# Patient Record
Sex: Female | Born: 1955 | Race: White | Hispanic: No | Smoking: Never smoker
Health system: Southern US, Community
[De-identification: ages and names within clinical notes are randomized; demographics above are authoritative.]

## PROBLEM LIST (undated history)

## (undated) DIAGNOSIS — K219 Gastro-esophageal reflux disease without esophagitis: Secondary | ICD-10-CM

## (undated) DIAGNOSIS — I8390 Asymptomatic varicose veins of unspecified lower extremity: Secondary | ICD-10-CM

## (undated) DIAGNOSIS — L309 Dermatitis, unspecified: Secondary | ICD-10-CM

## (undated) DIAGNOSIS — E039 Hypothyroidism, unspecified: Secondary | ICD-10-CM

## (undated) DIAGNOSIS — M7730 Calcaneal spur, unspecified foot: Secondary | ICD-10-CM

## (undated) DIAGNOSIS — IMO0002 Reserved for concepts with insufficient information to code with codable children: Secondary | ICD-10-CM

## (undated) DIAGNOSIS — M722 Plantar fascial fibromatosis: Secondary | ICD-10-CM

## (undated) DIAGNOSIS — R32 Unspecified urinary incontinence: Secondary | ICD-10-CM

## (undated) HISTORY — DX: Dermatitis, unspecified: L30.9

## (undated) HISTORY — DX: Calcaneal spur, unspecified foot: M77.30

## (undated) HISTORY — PX: VARICOSE VEIN SURGERY: SHX832

## (undated) HISTORY — DX: Asymptomatic varicose veins of unspecified lower extremity: I83.90

## (undated) HISTORY — PX: WISDOM TOOTH EXTRACTION: SHX21

## (undated) HISTORY — PX: TOTAL ABDOMINAL HYSTERECTOMY: SHX209

## (undated) HISTORY — DX: Plantar fascial fibromatosis: M72.2

## (undated) HISTORY — DX: Reserved for concepts with insufficient information to code with codable children: IMO0002

---

## 1998-03-02 ENCOUNTER — Ambulatory Visit (HOSPITAL_COMMUNITY): Admission: RE | Admit: 1998-03-02 | Discharge: 1998-03-02 | Payer: Self-pay | Admitting: Family Medicine

## 1998-04-09 ENCOUNTER — Ambulatory Visit (HOSPITAL_BASED_OUTPATIENT_CLINIC_OR_DEPARTMENT_OTHER): Admission: RE | Admit: 1998-04-09 | Discharge: 1998-04-09 | Payer: Self-pay

## 1998-09-30 ENCOUNTER — Other Ambulatory Visit: Admission: RE | Admit: 1998-09-30 | Discharge: 1998-09-30 | Payer: Self-pay | Admitting: Family Medicine

## 1999-03-21 ENCOUNTER — Encounter: Payer: Self-pay | Admitting: Family Medicine

## 1999-03-21 ENCOUNTER — Encounter: Admission: RE | Admit: 1999-03-21 | Discharge: 1999-03-21 | Payer: Self-pay | Admitting: Family Medicine

## 1999-10-11 ENCOUNTER — Other Ambulatory Visit: Admission: RE | Admit: 1999-10-11 | Discharge: 1999-10-11 | Payer: Self-pay | Admitting: Family Medicine

## 2000-10-15 ENCOUNTER — Other Ambulatory Visit: Admission: RE | Admit: 2000-10-15 | Discharge: 2000-10-15 | Payer: Self-pay | Admitting: Family Medicine

## 2001-02-18 ENCOUNTER — Encounter: Payer: Self-pay | Admitting: Family Medicine

## 2001-02-18 ENCOUNTER — Encounter: Admission: RE | Admit: 2001-02-18 | Discharge: 2001-02-18 | Payer: Self-pay | Admitting: Family Medicine

## 2001-07-05 ENCOUNTER — Other Ambulatory Visit: Admission: RE | Admit: 2001-07-05 | Discharge: 2001-07-05 | Payer: Self-pay

## 2001-11-08 ENCOUNTER — Other Ambulatory Visit: Admission: RE | Admit: 2001-11-08 | Discharge: 2001-11-08 | Payer: Self-pay

## 2002-02-19 ENCOUNTER — Encounter: Admission: RE | Admit: 2002-02-19 | Discharge: 2002-02-19 | Payer: Self-pay

## 2002-05-12 ENCOUNTER — Other Ambulatory Visit: Admission: RE | Admit: 2002-05-12 | Discharge: 2002-05-12 | Payer: Self-pay

## 2002-06-19 ENCOUNTER — Other Ambulatory Visit: Admission: RE | Admit: 2002-06-19 | Discharge: 2002-06-19 | Payer: Self-pay | Admitting: Obstetrics and Gynecology

## 2002-10-03 ENCOUNTER — Other Ambulatory Visit: Admission: RE | Admit: 2002-10-03 | Discharge: 2002-10-03 | Payer: Self-pay

## 2003-02-23 ENCOUNTER — Encounter: Admission: RE | Admit: 2003-02-23 | Discharge: 2003-02-23 | Payer: Self-pay | Admitting: Family Medicine

## 2003-03-23 ENCOUNTER — Other Ambulatory Visit: Admission: RE | Admit: 2003-03-23 | Discharge: 2003-03-23 | Payer: Self-pay | Admitting: Obstetrics and Gynecology

## 2003-09-02 ENCOUNTER — Other Ambulatory Visit: Admission: RE | Admit: 2003-09-02 | Discharge: 2003-09-02 | Payer: Self-pay | Admitting: Obstetrics and Gynecology

## 2004-04-26 ENCOUNTER — Encounter: Admission: RE | Admit: 2004-04-26 | Discharge: 2004-04-26 | Payer: Self-pay | Admitting: Family Medicine

## 2004-06-25 ENCOUNTER — Encounter: Admission: RE | Admit: 2004-06-25 | Discharge: 2004-06-25 | Payer: Self-pay | Admitting: Family Medicine

## 2005-02-15 ENCOUNTER — Other Ambulatory Visit: Admission: RE | Admit: 2005-02-15 | Discharge: 2005-02-15 | Payer: Self-pay | Admitting: Obstetrics and Gynecology

## 2005-05-02 ENCOUNTER — Ambulatory Visit (HOSPITAL_COMMUNITY): Admission: RE | Admit: 2005-05-02 | Discharge: 2005-05-02 | Payer: Self-pay | Admitting: Obstetrics and Gynecology

## 2005-05-02 ENCOUNTER — Encounter (INDEPENDENT_AMBULATORY_CARE_PROVIDER_SITE_OTHER): Payer: Self-pay | Admitting: Specialist

## 2006-03-24 ENCOUNTER — Inpatient Hospital Stay (HOSPITAL_COMMUNITY): Admission: AD | Admit: 2006-03-24 | Discharge: 2006-03-24 | Payer: Self-pay | Admitting: Obstetrics and Gynecology

## 2006-04-05 ENCOUNTER — Other Ambulatory Visit: Admission: RE | Admit: 2006-04-05 | Discharge: 2006-04-05 | Payer: Self-pay | Admitting: Obstetrics and Gynecology

## 2006-05-01 ENCOUNTER — Encounter: Admission: RE | Admit: 2006-05-01 | Discharge: 2006-05-01 | Payer: Self-pay | Admitting: Family Medicine

## 2006-05-03 ENCOUNTER — Inpatient Hospital Stay (HOSPITAL_COMMUNITY): Admission: RE | Admit: 2006-05-03 | Discharge: 2006-05-04 | Payer: Self-pay | Admitting: Obstetrics and Gynecology

## 2006-05-03 ENCOUNTER — Encounter (INDEPENDENT_AMBULATORY_CARE_PROVIDER_SITE_OTHER): Payer: Self-pay | Admitting: Specialist

## 2007-02-28 DIAGNOSIS — M7730 Calcaneal spur, unspecified foot: Secondary | ICD-10-CM

## 2007-02-28 HISTORY — DX: Calcaneal spur, unspecified foot: M77.30

## 2007-08-13 ENCOUNTER — Encounter: Admission: RE | Admit: 2007-08-13 | Discharge: 2007-08-13 | Payer: Self-pay | Admitting: Family Medicine

## 2008-08-25 ENCOUNTER — Encounter: Admission: RE | Admit: 2008-08-25 | Discharge: 2008-08-25 | Payer: Self-pay | Admitting: Family Medicine

## 2009-09-13 ENCOUNTER — Encounter: Admission: RE | Admit: 2009-09-13 | Discharge: 2009-09-13 | Payer: Self-pay | Admitting: Family Medicine

## 2010-03-19 ENCOUNTER — Encounter: Payer: Self-pay | Admitting: Family Medicine

## 2010-03-20 ENCOUNTER — Encounter: Payer: Self-pay | Admitting: Family Medicine

## 2010-07-15 NOTE — Op Note (Signed)
Christina Howell, Christina Howell             ACCOUNT NO.:  [id number]   MEDICAL RECORD NO.:  [id number]          PATIENT TYPE:  AMB   LOCATION:  SDC                           [facility]  [facility]   PHYSICIAN:  Charles A. Delcambre, MDDATE OF BIRTH:  1956-01-01   DATE OF PROCEDURE:  05/02/2005  DATE OF DISCHARGE:                                 OPERATIVE REPORT   PREOPERATIVE DIAGNOSES:  1.  Abnormal uterine bleeding, possibly perimenopausal.  2.  Endometrial polyps.   POSTOPERATIVE DIAGNOSES:  1.  Abnormal uterine bleeding, possibly perimenopausal.  2.  Endometrial polyps.   OPERATION/PROCEDURE:  1.  Paracervical block.  2.  Hysteroscopy and dilatation and curettage.  3.  Polypectomy.   SURGEON:  Charles A. Sydnee Cabal, M.D.   ASSISTANT:  None.   COMPLICATIONS:  None.   ESTIMATED BLOOD LOSS:  Less than 20 mL.   SPECIMENS:  Uterine curettings and endometrial polyps to pathology.   COUNTS:  Sponge, needle and instrument counts were correct.   FLUID DEFICIT:  Sorbitol lost during the procedure 0 mL approximately.   ANESTHESIA:  General by endotracheal route.   DESCRIPTION OF PROCEDURE:  The patient was taken to the operating room and  placed in the supine position and general anesthetic was induced without  difficulty via the endotracheal route secondary to the patient's coughing  prior to surgery with a tickle in her throat that developed just prior to  surgery in the holding area.  Judgment was made to protect her airway as I  understood so general anesthetic with intubation was undertaken.  She was  then placed in the dorsal lithotomy position and sterile prep and drape was  undertaken.  Single-tooth tenaculum was placed on the anterior lip of the  cervix with weighted speculum in the vagina and sound was to 9 cm and Hanks  dilators were used to dilate enough to pass a 5 mm hysteroscope.  The scope  was used to visualize several posterior wall.  Endometrial polyps were  documented  consistent with findings on sonohystogram.  Perioperatively using  polyp forceps and visualization, polyps were removed without difficulty.  Generalized curettage with banjo curet was then undertaken with no further  polypoid tissue but endometrial curettings were removed.  There was no  evidence of perforation prior to proceeding with dilation and curettage.  Paracervical block was placed at 4 and 8  o'clock with 20 mL divided 0.25% plain Marcaine for postoperative pain  control.  Prior to taking the patient to recovery, electrocautery was used  to on the tenaculum site with good hemostasis noted.  The patient was taken  to recovery with the physician in attendance having tolerated the procedure  well.      Charles A. Sydnee Cabal, MD  Electronically Signed     CAD/MEDQ  D:  05/02/2005  T:  05/03/2005  Job:  [id number]

## 2010-07-15 NOTE — Op Note (Signed)
[name], Christina Howell             ACCOUNT NO.:  [id number]   MEDICAL RECORD NO.:  [id number]          PATIENT TYPE:  OIB   LOCATION:  9399                          [facility]  [facility]   PHYSICIAN:  Charles A. Delcambre, MDDATE OF BIRTH:  Jul 31, 1955   DATE OF PROCEDURE:  05/03/2006  DATE OF DISCHARGE:                               OPERATIVE REPORT   PREOPERATIVE DIAGNOSIS:  Menorrhagia, failed medical and surgical  therapy.   POSTOPERATIVE DIAGNOSIS:  Menorrhagia, failed medical and surgical  therapy.   PROCEDURE:  Transvaginal hysterectomy, bilateral salpingo-oophorectomy.   SURGEON:  Charles A. Sydnee Cabal, M.D.   ASSISTANT:  Gerald Leitz, M.D.   COMPLICATIONS:  None.   ESTIMATED BLOOD LOSS:  250 mL.   ANESTHESIA:  General by the endotracheal route.   SPECIMEN:  The uterus and bilateral tubes and ovaries were sent to  pathology.   OPERATIVE FINDINGS:  Normal appearing ovaries.   COUNTS:  Instrument, sponge, and needle count correct x2.   URINE OUTPUT:  60 mL.   FLUIDS REPLACED:  1300 mL LR.   DESCRIPTION OF PROCEDURE:  The patient was taken to the operating room  and placed in a supine position.  She was given general anesthetic and  placed in the dorsal lithotomy position in universal stirrups.  Sterile  prep and drape was undertaken.  A weighted speculum was placed in the  vagina.  Lahey clamps were used to grasp the cervix and the uterus and  pulled it down nicely.  In the vault, a circumferential injection of 1%  lidocaine with epinephrine was done to assist with hemostasis.  A  scoring incision was made circumferentially around the cervix at  reflection of the vaginal mucosa. Sharp dissection was then used with  Mayo scissors to develop the mobilization of the bladder off the lower  uterine segment.  The bladder pillars were incised.  The Ray-Tec was  then passed into this space further dissecting the space bluntly. After  removal of the Ray-Tec, the peritoneum could  be seen with a Deaver  easily and was entered with Metzenbaum scissors without damage to the  bowel or bladder.  A posterior colpotomy was then done without  difficulty.  A long weighted speculum was placed in the posterior cul-de-  sac.   The uterosacral ligaments were taken bilaterally, transfixion stitched  with 0 Vicryl and held. Uterine vessels were isolated on either side and  taken in two bites each to get up to the cardinal ligaments and  hemostasis was excellent with 0 Vicryl simple stitch and then  transfixion stitch.  The remaining pedicles were taken up either side  including the remainder of the cardinal ligaments and the broad  ligament.  Once we had dissection and pedicles taken up to the utero-  ovarian pedicles, the uterus was flipped anteriorly and the utero-  ovarian pedicles were taken, free tied, and transfixion stitch on both  sides.  The specimen was handed off to pathology.  On the left, the tube  and ovary were seen and isolated with a Kelly clamp and excised to go to  pathology.  A  free tie and transfixion stitch was placed on the  infundibulopelvic pedicle with good hemostasis.  A small peritoneal  window on the sidewall was closed with 2-0 Vicryl with good hemostasis.  Attention was turned to the right side and, in a like fashion, the ovary  and tube were excised with the [facility] clamp holding the infundibulopelvic  ligament pedicle, free tie and transfixion stitch were placed with 0  Vicryl and with good hemostasis resulting. Again, there was a small  window of peritoneum on the right side.  This was closed with a 2-0  Vicryl with good hemostasis resulting.   All pedicles had good hemostasis.  There was minor bleeding at the  posterior edge of the cuff.  Richardson angle sutures of 0 Vicryl were  placed at the vaginal angle regions bilaterally and cut.  A running  suture of 0 Vicryl running interlocking across the cuff was then placed  to close the cuff.   Hemostasis was excellent.  The Foley catheter was  left in place.  The patient was taken to recovery with the physician in  attendance, having tolerated the procedure well.      Charles A. Sydnee Cabal, MD  Electronically Signed     CAD/MEDQ  D:  05/03/2006  T:  05/03/2006  Job:  [id number]

## 2010-07-15 NOTE — H&P (Signed)
Christina Howell, Christina Howell             ACCOUNT NO.:  [id number]   MEDICAL RECORD NO.:  [id number]          PATIENT TYPE:  AMB   LOCATION:  SDC                           [facility]   PHYSICIAN:  Charles A. Delcambre, MDDATE OF BIRTH:  1956/02/16   DATE OF ADMISSION:  05/02/2005  DATE OF DISCHARGE:                                HISTORY & PHYSICAL   This is a 55 year old para 2-0-1-2 to be admitted on May 02, 2005, to  undergo hysteroscopy D&C for irregular menstrual periods and known  endometrial polyps on sonohysterogram.  Endometrial biopsy was done on September 21, 2003, which was benign.  Sonohysterogram was done on March 22, 2005,  which showed multiple posterior wall defects, three polyps seen on the  posterior wall and for this reason, she is to be admitted to undergo  hysteroscopy D&C with polypectomy.  She gives informed consent, accepts  risks of infection, bleeding, bowel and bladder damage, blood products with  risks including hepatitis and HIV exposure, uterine perforation risk.  All  questions were answered and we will proceed as outlined.  She will remain  NPO past midnight on the evening prior to the procedure.   PAST MEDICAL HISTORY:  None.   PAST SURGICAL HISTORY:  None.   MEDICATIONS:  None.   ALLERGIES:  No known drug allergies.   SOCIAL HISTORY:  Divorced.  Teacher at [employer].  One son, 41,  who is autistic.   FAMILY HISTORY:  Positive for diabetes, hypertension, and skin cancer.   REVIEW OF SYSTEMS:  Irregular periods.  No fever, chills, nausea, vomiting,  diarrhea, or constipation.  Some seasonal allergies.  No chest pain,  shortness of breath, or wheezing.  No diarrhea, constipation, or bleeding,  melena or hematochezia. No urgency, frequency, dysuria, incontinence, or  hematuria.  No galactorrhea or emotional changes.   PHYSICAL EXAMINATION:  GENERAL:  Alert and oriented x3, in no acute  distress.  VITAL SIGNS:  Blood pressure 94/60,  weight 173 pounds, pulse 80.  HEENT:  Grossly within normal limits.  NECK:  Supple without thyromegaly or adenopathy.  LUNGS:  Clear bilaterally.  BACK:  No CVAT.  BREASTS:  Symmetrical, otherwise deferred.  HEART:  Regular rate and rhythm without murmurs, rubs, or gallops.  ABDOMEN:  Soft, flat, and nontender.  No hepatosplenomegaly or other masses  noted.  PELVIC:  Normal external female genitalia, Bartholin's, urethra, and Skene's  within normal limits.  Vault without discharge or lesions.  Multipara  cervix.  Uterus is 8 weeks size and mid plane.  Adnexa nontender without  masses bilaterally.  Ovaries palpably normal size bilaterally as well as  seen on ultrasound to be normal.  Anus and perineal body appear normal.   ASSESSMENT:  Irregular bleeding, possibly perimenopausal, but with known  endometrial polyps x3 on posterior wall of the uterus.   PLAN:  Hysteroscopy and D&C, NPO past midnight.  Preoperative CBC and serum  pregnancy test.  All questions were answered and we will proceed as  outlined.      Charles A. Sydnee Cabal, MD  Electronically Signed  CAD/MEDQ  D:  04/27/2005  T:  04/27/2005  Job:  [id number]

## 2010-07-15 NOTE — H&P (Signed)
Christina Howell, Christina Howell             ACCOUNT NO.:  [id number]   MEDICAL RECORD NO.:  [id number]          PATIENT TYPE:  AMB   LOCATION:  SDC                           [facility]   PHYSICIAN:  Charles A. Delcambre, MDDATE OF BIRTH:  [birth year]   DATE OF ADMISSION:  05/03/2006  DATE OF DISCHARGE:                              HISTORY & PHYSICAL   Patient is to be admitted to undergo transvaginal hysterectomy and  probable bilateral salpingo-oophorectomy secondary to ongoing bleeding  despite hysteroscopy, D&D, and polypectomy back in March of 2007.   PAST MEDICAL HISTORY:  None.   PAST SURGICAL HISTORY:  Hysteroscopy, D&C, polyp resection.   MEDICATIONS:  None.   ALLERGIES:  NO KNOWN DRUG ALLERGIES.   SOCIAL HISTORY:  No tobacco, ethanol, or drug use.  She is divorced and  not sexually active at this time.  She is a Runner, broadcasting/film/video at [employer].   FAMILY HISTORY:  Positive for diabetes, hypertension, and skin cancer.   REVIEW OF SYSTEMS:  No fever, chills, nausea, vomiting, diarrhea,  constipation.  Some seasonal allergy.  No chest pain, shortness of  breath, or wheezing.  No diarrhea, constipation, bleeding, melena, or  hematochezia.  No urgency, frequency, dysuria, incontinence, or  hematuria.  No galactorrhea or emotional changes.  She has recently  gotten over a cold that she had for two weeks and is asymptomatic over  the last week.   PHYSICAL EXAMINATION:  GENERAL:  Alert and oriented x3 in no distress.  VITAL SIGNS:  Blood pressure 110/80, respirations 20, pulse 76,  afebrile.  HEENT:  Grossly within normal limits.  NECK:  Supple without thyromegaly or adenopathy.  LUNGS:  Clear bilaterally.  HEART:  Regular rate and rhythm without murmurs, rubs, or gallops.  BREASTS:  No masses, tenderness, discharge, skin, or nipple changes at  the time of my last examination.  My last examination for a breast exam  was February 15, 2005.  Otherwise, symmetrical and without  symptoms.  ABDOMEN:  No hepatosplenomegaly or other masses noted.  No guarding,  rebound, or peritoneal signs.  BACK:  No CVAT.  Vertebral column nontender to palpation.  PELVIC:  Normal external female genitalia.  Bartholin, urethral, and  Skene's glands within normal limits.  Vault without discharge or  lesions.  Cervix folds down to was a centimeter of the introitus with a  tenaculum.  Uterus 8 to 10-week size.  Adnexa nontender without masses  bilaterally.  Ovaries normal size bilaterally.  EXTREMITIES:  Nontender.   ASSESSMENT:  Continued menorrhagia despite hysteroscopy, D&C, and  polypectomy, not responding to hormonal therapy with birth control pills  as well.   PLAN:  Transvaginal hysterectomy with possible bilateral salpingo-  oophorectomy (desired).  We will proceed but not go above for ovaries if  necessary and oophorectomy cannot technically be achieved through  vagina.  She accepts the risks of infection, bleeding, bowel or bladder  damage, blood product risks including hepatitis and HIV exposure,  ureteral damage, and DVT.  All questions were answered.  She understands  oophorectomy will put her into menopause.  She declines  hormone  replacement therapy at this time.  She will remain n.p.o. past midnight.  Preoperative CBC and type screen will be done.  Preoperative cefoxitin 2  g IV.  SCDs will be used perioperatively.  She will remain n.p.o. for  eight hours prior to surgery.  All questions were answered.  We will  proceed as outlined.      Charles A. Sydnee Cabal, MD  Electronically Signed     CAD/MEDQ  D:  05/01/2006  T:  05/01/2006  Job:  [id number]

## 2010-07-20 ENCOUNTER — Encounter: Payer: Self-pay | Admitting: Pulmonary Disease

## 2010-07-21 ENCOUNTER — Institutional Professional Consult (permissible substitution): Payer: Self-pay | Admitting: Pulmonary Disease

## 2010-07-22 ENCOUNTER — Ambulatory Visit (INDEPENDENT_AMBULATORY_CARE_PROVIDER_SITE_OTHER): Payer: 59 | Admitting: Pulmonary Disease

## 2010-07-22 ENCOUNTER — Encounter: Payer: Self-pay | Admitting: Pulmonary Disease

## 2010-07-22 VITALS — BP 100/64 | HR 81 | Temp 98.0°F | Ht 68.5 in | Wt 176.0 lb

## 2010-07-22 DIAGNOSIS — R05 Cough: Secondary | ICD-10-CM

## 2010-07-22 DIAGNOSIS — R0789 Other chest pain: Secondary | ICD-10-CM | POA: Insufficient documentation

## 2010-07-22 DIAGNOSIS — R053 Chronic cough: Secondary | ICD-10-CM

## 2010-07-22 NOTE — Patient Instructions (Signed)
Trial of nexium 40mg  one in am and pm before meal for next 2 weeks. Chlorpheniramine 8mg  at bedtime for next 2 weeks.  Please call and give me feedback in 2 weeks with how things are going.  Avoid throat clearing....keep hard candy, no peppermint or menthol, in your mouth all day to see if helps with sensation in throat.

## 2010-07-22 NOTE — Assessment & Plan Note (Signed)
The pt has persistent chest tightness and a significant globus sensation, but denies any cough or sinus/chest congestion.  She has normal spirometry and cxr recently, and her lungs are clear today on exam.  I suspect this is not a pulmonary issue, and would be suspicious of LPR.  She was tried on low dose PPI without improvement, but would like to try her on high dose to see if a difference.  Would also consider postnasal drip, but this usually doesn't cause chest tightness.  I have told her that normal spiro does not 100% exclude asthma, but her history is not suggestive of this.  If she continues to have symptoms, can consider methacholine challenge testing.

## 2010-07-22 NOTE — Progress Notes (Signed)
  Subjective:    Patient ID: Christina Howell, female    DOB: March 04, 1955, 55 y.o.   MRN: [id number]  HPI The pt is a 55y/o female who I have been asked to see for chest tightness of unknown origin.  She developed her symptom in March of this year, and was associated with a globus sensation and constant throat clearing.  She denies having a cough or sob.  She would "force" a cough to try and clear her globus sensation, but did not feel the urge to cough.  This was worse at night, and would also interfere with sleep.  She had a cxr in Apr that was unremarkable, and her spirometry was normal as well.  She was treated with abx times two, as well as a prednisone taper, and was without improvement.  The pt denies sinus pressure or purulence during this time, but felt she may have a pnd.  She denies any type of reflux symptoms.  2 days ago she developed viral URI sx with both sinus and GI involvement.   Review of Systems  Constitutional: Negative for fever and unexpected weight change.  HENT: Positive for trouble swallowing. Negative for ear pain, nosebleeds, congestion, sore throat, rhinorrhea, sneezing, dental problem, postnasal drip and sinus pressure.   Eyes: Negative for redness and itching.  Respiratory: Positive for cough and shortness of breath. Negative for chest tightness and wheezing.   Cardiovascular: Positive for chest pain. Negative for palpitations and leg swelling.  Gastrointestinal: Negative for nausea and vomiting.  Genitourinary: Negative for dysuria.  Musculoskeletal: Negative for joint swelling.  Skin: Negative for rash.  Neurological: Negative for headaches.  Hematological: Does not bruise/bleed easily.  Psychiatric/Behavioral: Negative for dysphoric mood. The patient is not nervous/anxious.        Objective:   Physical Exam Constitutional:  Well developed, no acute distress  HENT:  Nares patent without discharge, but boggy mucosa with erythema.   Oropharynx without exudate,  palate and uvula are normal  Eyes:  Perrla, eomi, no scleral icterus  Neck:  No JVD, no TMG  Cardiovascular:  Normal rate, regular rhythm, no rubs or gallops.  No murmurs        Intact distal pulses  Pulmonary :  Normal breath sounds, no stridor or respiratory distress   No rales, rhonchi, or wheezing  Abdominal:  Soft, nondistended, bowel sounds present.  No tenderness noted.   Musculoskeletal:  No lower extremity edema noted, +varicosities  Lymph Nodes:  No cervical lymphadenopathy noted  Skin:  No cyanosis noted  Neurologic:  Alert, appropriate, moves all 4 extremities without obvious deficit.         Assessment & Plan:

## 2010-08-09 ENCOUNTER — Telehealth: Payer: Self-pay | Admitting: Pulmonary Disease

## 2010-08-09 MED ORDER — HYDROXYZINE HCL 50 MG PO TABS: 50.0000 mg | ORAL_TABLET | Freq: Two times a day (BID) | ORAL | Status: AC

## 2010-08-09 NOTE — Telephone Encounter (Signed)
Per SN: rec to continue all therapies from Dr. Shelle Iron and to f/u with him. Can call in Hydroxyzine 50mg  1 po bid # 20 x 0 refills.    Called and spoke with pt. Pt aware of SN's recs and rx sent to pharmacy--- [facility] on [address].

## 2010-08-09 NOTE — Telephone Encounter (Signed)
[id number] 

## 2010-08-09 NOTE — Telephone Encounter (Signed)
Called and spoke with pt.  Pt was seen by [facility] on 07/22/10 for a pulm consult for tightness in chest and globus sensation in throat.  Pt states she was given nexium samples to take bid which she is now about out of.  Pt states since taking the nexium she has noticed no change- states she still has tightness in her chest that comes and goes and is no better or worse and also still has this "wad in throat" feeling that hasn't improved.  Pt states she is also sucking on hard candy, avoiding throat clearing and taking Chlorpheniramine 8mg  at bedtime with no relief of symptoms. Pt states the chlorpheniramine "dries up her mouth and nose but still continues to have this sensation in her throat."   Pt denies a cough or sob or congestion.  Kc out of office this week. Will forward message to doc of the day to address.

## 2010-09-07 ENCOUNTER — Other Ambulatory Visit: Payer: Self-pay | Admitting: Family Medicine

## 2010-09-07 ENCOUNTER — Other Ambulatory Visit: Payer: Self-pay | Admitting: Pulmonary Disease

## 2010-09-07 ENCOUNTER — Telehealth: Payer: Self-pay | Admitting: Pulmonary Disease

## 2010-09-07 DIAGNOSIS — R6889 Other general symptoms and signs: Secondary | ICD-10-CM | POA: Insufficient documentation

## 2010-09-07 DIAGNOSIS — Z1231 Encounter for screening mammogram for malignant neoplasm of breast: Secondary | ICD-10-CM

## 2010-09-07 NOTE — Telephone Encounter (Signed)
Called and spoke with pt. She states that although is is not coughing any longer, the "junk in throat" is not getting any better. She states that chlorpheneramine caused dry mouth, even with just taking 1/2 tab before bedtime, so she has stopped. She wants to know what the next step may be to help with this. Please advise, thanks!

## 2010-09-07 NOTE — Telephone Encounter (Signed)
Pt states she stopped nexium in June because it was not helping. She denies any sinus pressure of congestion.  I advised her is takes time to resolve and she states its been long enough. Please advise. Carron Curie, CMA

## 2010-09-07 NOTE — Telephone Encounter (Signed)
The "junk in throat" is usually caused by postnasal drip or reflux disease.  Is she still taking something for reflux, and if she is, what?  Is she having sinus pressure or congestion that is ongoing?  Let me know.   The sensation in her throat usually takes time to resolve.  The hard candy should help as well.

## 2010-09-07 NOTE — Telephone Encounter (Signed)
If she is still having a feeling of fullness in her throat, needs to have evaluation by ENT to make sure there is nothing structurally wrong, and if they see anything to explain her sensation.  Will send referral to [facility]

## 2010-09-08 NOTE — Telephone Encounter (Signed)
Spoke with pt and notified of recs per KC She verbalized understanding and denied any questions  

## 2010-09-08 NOTE — Telephone Encounter (Signed)
[email or web address]  

## 2010-09-21 ENCOUNTER — Ambulatory Visit: Payer: 59

## 2010-09-21 ENCOUNTER — Ambulatory Visit
Admission: RE | Admit: 2010-09-21 | Discharge: 2010-09-21 | Disposition: A | Discharge status: Home / Self Care | Payer: 59 | Source: Ambulatory Visit | Attending: Family Medicine | Admitting: Family Medicine

## 2010-09-21 DIAGNOSIS — Z1231 Encounter for screening mammogram for malignant neoplasm of breast: Secondary | ICD-10-CM

## 2011-09-04 ENCOUNTER — Other Ambulatory Visit: Payer: Self-pay | Admitting: Family Medicine

## 2011-09-04 DIAGNOSIS — Z1231 Encounter for screening mammogram for malignant neoplasm of breast: Secondary | ICD-10-CM

## 2011-09-22 ENCOUNTER — Ambulatory Visit
Admission: RE | Admit: 2011-09-22 | Discharge: 2011-09-22 | Disposition: A | Discharge status: Home / Self Care | Payer: 59 | Source: Ambulatory Visit | Attending: Family Medicine | Admitting: Family Medicine

## 2011-09-22 DIAGNOSIS — Z1231 Encounter for screening mammogram for malignant neoplasm of breast: Secondary | ICD-10-CM

## 2012-08-23 ENCOUNTER — Other Ambulatory Visit: Payer: Self-pay

## 2012-08-23 DIAGNOSIS — Z1231 Encounter for screening mammogram for malignant neoplasm of breast: Secondary | ICD-10-CM

## 2012-09-23 ENCOUNTER — Ambulatory Visit
Admission: RE | Admit: 2012-09-23 | Discharge: 2012-09-23 | Disposition: A | Discharge status: Home / Self Care | Payer: 59 | Source: Ambulatory Visit

## 2012-09-23 DIAGNOSIS — Z1231 Encounter for screening mammogram for malignant neoplasm of breast: Secondary | ICD-10-CM

## 2013-08-21 ENCOUNTER — Other Ambulatory Visit: Payer: Self-pay

## 2013-08-21 DIAGNOSIS — Z1231 Encounter for screening mammogram for malignant neoplasm of breast: Secondary | ICD-10-CM

## 2013-09-24 ENCOUNTER — Ambulatory Visit: Payer: 59

## 2013-09-25 ENCOUNTER — Ambulatory Visit
Admission: RE | Admit: 2013-09-25 | Discharge: 2013-09-25 | Disposition: A | Discharge status: Home / Self Care | Payer: Commercial Managed Care - PPO | Source: Ambulatory Visit

## 2013-09-25 DIAGNOSIS — Z1231 Encounter for screening mammogram for malignant neoplasm of breast: Secondary | ICD-10-CM

## 2014-09-07 ENCOUNTER — Other Ambulatory Visit: Payer: Self-pay

## 2014-09-07 DIAGNOSIS — Z1231 Encounter for screening mammogram for malignant neoplasm of breast: Secondary | ICD-10-CM

## 2014-09-28 ENCOUNTER — Ambulatory Visit
Admission: RE | Admit: 2014-09-28 | Discharge: 2014-09-28 | Disposition: A | Discharge status: Home / Self Care | Payer: Commercial Managed Care - PPO | Source: Ambulatory Visit

## 2014-09-28 DIAGNOSIS — Z1231 Encounter for screening mammogram for malignant neoplasm of breast: Secondary | ICD-10-CM

## 2015-09-03 ENCOUNTER — Other Ambulatory Visit: Payer: Self-pay | Admitting: Family Medicine

## 2015-09-03 DIAGNOSIS — Z1231 Encounter for screening mammogram for malignant neoplasm of breast: Secondary | ICD-10-CM

## 2015-09-29 ENCOUNTER — Ambulatory Visit
Admission: RE | Admit: 2015-09-29 | Discharge: 2015-09-29 | Disposition: A | Discharge status: Home / Self Care | Payer: 59 | Source: Ambulatory Visit | Attending: Family Medicine | Admitting: Family Medicine

## 2015-09-29 DIAGNOSIS — Z1231 Encounter for screening mammogram for malignant neoplasm of breast: Secondary | ICD-10-CM

## 2016-08-25 ENCOUNTER — Other Ambulatory Visit: Payer: Self-pay | Admitting: Family Medicine

## 2016-08-25 DIAGNOSIS — Z1231 Encounter for screening mammogram for malignant neoplasm of breast: Secondary | ICD-10-CM

## 2016-10-12 ENCOUNTER — Ambulatory Visit
Admission: RE | Admit: 2016-10-12 | Discharge: 2016-10-12 | Disposition: A | Discharge status: Home / Self Care | Payer: PRIVATE HEALTH INSURANCE | Source: Ambulatory Visit | Attending: Family Medicine | Admitting: Family Medicine

## 2016-10-12 DIAGNOSIS — Z1231 Encounter for screening mammogram for malignant neoplasm of breast: Secondary | ICD-10-CM

## 2017-03-13 DIAGNOSIS — E069 Thyroiditis, unspecified: Secondary | ICD-10-CM | POA: Diagnosis not present

## 2017-03-13 DIAGNOSIS — Z6829 Body mass index (BMI) 29.0-29.9, adult: Secondary | ICD-10-CM | POA: Diagnosis not present

## 2017-03-13 DIAGNOSIS — R1310 Dysphagia, unspecified: Secondary | ICD-10-CM | POA: Diagnosis not present

## 2017-03-13 DIAGNOSIS — R0602 Shortness of breath: Secondary | ICD-10-CM | POA: Diagnosis not present

## 2017-03-13 DIAGNOSIS — R221 Localized swelling, mass and lump, neck: Secondary | ICD-10-CM | POA: Diagnosis not present

## 2017-03-20 DIAGNOSIS — E041 Nontoxic single thyroid nodule: Secondary | ICD-10-CM | POA: Diagnosis not present

## 2017-03-20 DIAGNOSIS — J029 Acute pharyngitis, unspecified: Secondary | ICD-10-CM | POA: Diagnosis not present

## 2017-03-20 DIAGNOSIS — E039 Hypothyroidism, unspecified: Secondary | ICD-10-CM | POA: Diagnosis not present

## 2017-03-20 DIAGNOSIS — R1310 Dysphagia, unspecified: Secondary | ICD-10-CM | POA: Diagnosis not present

## 2017-03-20 DIAGNOSIS — Z6829 Body mass index (BMI) 29.0-29.9, adult: Secondary | ICD-10-CM | POA: Diagnosis not present

## 2017-03-20 DIAGNOSIS — R0989 Other specified symptoms and signs involving the circulatory and respiratory systems: Secondary | ICD-10-CM | POA: Diagnosis not present

## 2017-03-20 DIAGNOSIS — E069 Thyroiditis, unspecified: Secondary | ICD-10-CM | POA: Diagnosis not present

## 2017-03-27 DIAGNOSIS — F411 Generalized anxiety disorder: Secondary | ICD-10-CM | POA: Diagnosis not present

## 2017-03-27 DIAGNOSIS — R221 Localized swelling, mass and lump, neck: Secondary | ICD-10-CM | POA: Diagnosis not present

## 2017-03-27 DIAGNOSIS — R0600 Dyspnea, unspecified: Secondary | ICD-10-CM | POA: Diagnosis not present

## 2017-03-27 DIAGNOSIS — J358 Other chronic diseases of tonsils and adenoids: Secondary | ICD-10-CM | POA: Diagnosis not present

## 2017-03-27 DIAGNOSIS — R0602 Shortness of breath: Secondary | ICD-10-CM | POA: Diagnosis not present

## 2017-03-27 DIAGNOSIS — E049 Nontoxic goiter, unspecified: Secondary | ICD-10-CM | POA: Diagnosis not present

## 2017-03-27 DIAGNOSIS — R070 Pain in throat: Secondary | ICD-10-CM | POA: Diagnosis not present

## 2017-03-27 DIAGNOSIS — J039 Acute tonsillitis, unspecified: Secondary | ICD-10-CM | POA: Diagnosis not present

## 2017-03-27 DIAGNOSIS — Z8639 Personal history of other endocrine, nutritional and metabolic disease: Secondary | ICD-10-CM | POA: Diagnosis not present

## 2017-03-27 DIAGNOSIS — Z90710 Acquired absence of both cervix and uterus: Secondary | ICD-10-CM | POA: Diagnosis not present

## 2017-03-28 DIAGNOSIS — J358 Other chronic diseases of tonsils and adenoids: Secondary | ICD-10-CM | POA: Diagnosis not present

## 2017-03-30 DIAGNOSIS — R1310 Dysphagia, unspecified: Secondary | ICD-10-CM | POA: Diagnosis not present

## 2017-03-30 DIAGNOSIS — R682 Dry mouth, unspecified: Secondary | ICD-10-CM | POA: Diagnosis not present

## 2017-03-30 DIAGNOSIS — J029 Acute pharyngitis, unspecified: Secondary | ICD-10-CM | POA: Diagnosis not present

## 2017-03-30 DIAGNOSIS — Z6828 Body mass index (BMI) 28.0-28.9, adult: Secondary | ICD-10-CM | POA: Diagnosis not present

## 2017-03-30 DIAGNOSIS — K219 Gastro-esophageal reflux disease without esophagitis: Secondary | ICD-10-CM | POA: Diagnosis not present

## 2017-04-02 DIAGNOSIS — E039 Hypothyroidism, unspecified: Secondary | ICD-10-CM | POA: Diagnosis not present

## 2017-04-02 DIAGNOSIS — R5383 Other fatigue: Secondary | ICD-10-CM | POA: Diagnosis not present

## 2017-04-02 DIAGNOSIS — E069 Thyroiditis, unspecified: Secondary | ICD-10-CM | POA: Diagnosis not present

## 2017-04-02 DIAGNOSIS — R682 Dry mouth, unspecified: Secondary | ICD-10-CM | POA: Diagnosis not present

## 2017-04-04 DIAGNOSIS — Z6828 Body mass index (BMI) 28.0-28.9, adult: Secondary | ICD-10-CM | POA: Diagnosis not present

## 2017-04-04 DIAGNOSIS — K219 Gastro-esophageal reflux disease without esophagitis: Secondary | ICD-10-CM | POA: Diagnosis not present

## 2017-04-04 DIAGNOSIS — R740 Nonspecific elevation of levels of transaminase and lactic acid dehydrogenase [LDH]: Secondary | ICD-10-CM | POA: Diagnosis not present

## 2017-04-04 DIAGNOSIS — E039 Hypothyroidism, unspecified: Secondary | ICD-10-CM | POA: Diagnosis not present

## 2017-04-04 DIAGNOSIS — E785 Hyperlipidemia, unspecified: Secondary | ICD-10-CM | POA: Diagnosis not present

## 2017-04-05 DIAGNOSIS — F458 Other somatoform disorders: Secondary | ICD-10-CM | POA: Diagnosis not present

## 2017-04-05 DIAGNOSIS — Z6828 Body mass index (BMI) 28.0-28.9, adult: Secondary | ICD-10-CM | POA: Diagnosis not present

## 2017-04-05 DIAGNOSIS — E039 Hypothyroidism, unspecified: Secondary | ICD-10-CM | POA: Diagnosis not present

## 2017-04-05 DIAGNOSIS — K219 Gastro-esophageal reflux disease without esophagitis: Secondary | ICD-10-CM | POA: Diagnosis not present

## 2017-04-09 DIAGNOSIS — K2970 Gastritis, unspecified, without bleeding: Secondary | ICD-10-CM | POA: Diagnosis not present

## 2017-04-09 DIAGNOSIS — K228 Other specified diseases of esophagus: Secondary | ICD-10-CM | POA: Diagnosis not present

## 2017-04-09 DIAGNOSIS — R1310 Dysphagia, unspecified: Secondary | ICD-10-CM | POA: Diagnosis not present

## 2017-04-09 DIAGNOSIS — K2950 Unspecified chronic gastritis without bleeding: Secondary | ICD-10-CM | POA: Diagnosis not present

## 2017-04-09 DIAGNOSIS — K449 Diaphragmatic hernia without obstruction or gangrene: Secondary | ICD-10-CM | POA: Diagnosis not present

## 2017-04-09 DIAGNOSIS — R10816 Epigastric abdominal tenderness: Secondary | ICD-10-CM | POA: Diagnosis not present

## 2017-04-09 DIAGNOSIS — K210 Gastro-esophageal reflux disease with esophagitis: Secondary | ICD-10-CM | POA: Diagnosis not present

## 2017-04-09 DIAGNOSIS — R12 Heartburn: Secondary | ICD-10-CM | POA: Diagnosis not present

## 2017-04-19 DIAGNOSIS — R1310 Dysphagia, unspecified: Secondary | ICD-10-CM | POA: Diagnosis not present

## 2017-04-20 ENCOUNTER — Other Ambulatory Visit (HOSPITAL_COMMUNITY): Payer: Self-pay | Admitting: Family Medicine

## 2017-04-20 DIAGNOSIS — R1310 Dysphagia, unspecified: Secondary | ICD-10-CM

## 2017-05-14 ENCOUNTER — Ambulatory Visit (HOSPITAL_COMMUNITY)
Admission: RE | Admit: 2017-05-14 | Discharge: 2017-05-14 | Disposition: A | Discharge status: Home / Self Care | Payer: BLUE CROSS/BLUE SHIELD | Source: Ambulatory Visit | Attending: Family Medicine | Admitting: Family Medicine

## 2017-05-14 DIAGNOSIS — R1310 Dysphagia, unspecified: Secondary | ICD-10-CM | POA: Insufficient documentation

## 2017-05-14 DIAGNOSIS — A630 Anogenital (venereal) warts: Secondary | ICD-10-CM | POA: Diagnosis not present

## 2017-06-01 DIAGNOSIS — T783XXA Angioneurotic edema, initial encounter: Secondary | ICD-10-CM | POA: Diagnosis not present

## 2017-06-01 DIAGNOSIS — J3089 Other allergic rhinitis: Secondary | ICD-10-CM | POA: Diagnosis not present

## 2017-06-25 DIAGNOSIS — H01009 Unspecified blepharitis unspecified eye, unspecified eyelid: Secondary | ICD-10-CM | POA: Diagnosis not present

## 2017-06-26 DIAGNOSIS — A630 Anogenital (venereal) warts: Secondary | ICD-10-CM | POA: Diagnosis not present

## 2017-07-20 DIAGNOSIS — A630 Anogenital (venereal) warts: Secondary | ICD-10-CM | POA: Diagnosis not present

## 2017-10-17 DIAGNOSIS — E039 Hypothyroidism, unspecified: Secondary | ICD-10-CM | POA: Diagnosis not present

## 2017-10-17 DIAGNOSIS — Z8249 Family history of ischemic heart disease and other diseases of the circulatory system: Secondary | ICD-10-CM | POA: Diagnosis not present

## 2017-10-17 DIAGNOSIS — Z13220 Encounter for screening for lipoid disorders: Secondary | ICD-10-CM | POA: Diagnosis not present

## 2017-10-17 DIAGNOSIS — Z0000 Encounter for general adult medical examination without abnormal findings: Secondary | ICD-10-CM | POA: Diagnosis not present

## 2017-11-01 ENCOUNTER — Other Ambulatory Visit: Payer: Self-pay | Admitting: Family Medicine

## 2017-11-01 DIAGNOSIS — Z1231 Encounter for screening mammogram for malignant neoplasm of breast: Secondary | ICD-10-CM

## 2017-11-05 DIAGNOSIS — L718 Other rosacea: Secondary | ICD-10-CM | POA: Diagnosis not present

## 2017-11-20 DIAGNOSIS — J029 Acute pharyngitis, unspecified: Secondary | ICD-10-CM | POA: Diagnosis not present

## 2017-11-27 ENCOUNTER — Ambulatory Visit
Admission: RE | Admit: 2017-11-27 | Discharge: 2017-11-27 | Disposition: A | Discharge status: Home / Self Care | Payer: BLUE CROSS/BLUE SHIELD | Source: Ambulatory Visit | Attending: Family Medicine | Admitting: Family Medicine

## 2017-11-27 DIAGNOSIS — Z1231 Encounter for screening mammogram for malignant neoplasm of breast: Secondary | ICD-10-CM

## 2017-12-07 DIAGNOSIS — H6123 Impacted cerumen, bilateral: Secondary | ICD-10-CM | POA: Diagnosis not present

## 2017-12-07 DIAGNOSIS — H9203 Otalgia, bilateral: Secondary | ICD-10-CM | POA: Diagnosis not present

## 2017-12-07 DIAGNOSIS — R42 Dizziness and giddiness: Secondary | ICD-10-CM | POA: Diagnosis not present

## 2017-12-14 DIAGNOSIS — B029 Zoster without complications: Secondary | ICD-10-CM | POA: Diagnosis not present

## 2017-12-18 DIAGNOSIS — B029 Zoster without complications: Secondary | ICD-10-CM | POA: Diagnosis not present

## 2017-12-31 DIAGNOSIS — S86811A Strain of other muscle(s) and tendon(s) at lower leg level, right leg, initial encounter: Secondary | ICD-10-CM | POA: Diagnosis not present

## 2018-01-16 DIAGNOSIS — M79604 Pain in right leg: Secondary | ICD-10-CM | POA: Diagnosis not present

## 2018-01-21 DIAGNOSIS — R262 Difficulty in walking, not elsewhere classified: Secondary | ICD-10-CM | POA: Diagnosis not present

## 2018-01-21 DIAGNOSIS — M79604 Pain in right leg: Secondary | ICD-10-CM | POA: Diagnosis not present

## 2018-01-21 DIAGNOSIS — M6281 Muscle weakness (generalized): Secondary | ICD-10-CM | POA: Diagnosis not present

## 2018-01-21 DIAGNOSIS — M25561 Pain in right knee: Secondary | ICD-10-CM | POA: Diagnosis not present

## 2018-01-23 DIAGNOSIS — M79604 Pain in right leg: Secondary | ICD-10-CM | POA: Diagnosis not present

## 2018-01-23 DIAGNOSIS — R262 Difficulty in walking, not elsewhere classified: Secondary | ICD-10-CM | POA: Diagnosis not present

## 2018-01-23 DIAGNOSIS — M6281 Muscle weakness (generalized): Secondary | ICD-10-CM | POA: Diagnosis not present

## 2018-01-23 DIAGNOSIS — M25561 Pain in right knee: Secondary | ICD-10-CM | POA: Diagnosis not present

## 2018-01-28 DIAGNOSIS — R262 Difficulty in walking, not elsewhere classified: Secondary | ICD-10-CM | POA: Diagnosis not present

## 2018-01-28 DIAGNOSIS — M6281 Muscle weakness (generalized): Secondary | ICD-10-CM | POA: Diagnosis not present

## 2018-01-28 DIAGNOSIS — M25561 Pain in right knee: Secondary | ICD-10-CM | POA: Diagnosis not present

## 2018-01-28 DIAGNOSIS — M79604 Pain in right leg: Secondary | ICD-10-CM | POA: Diagnosis not present

## 2018-01-29 ENCOUNTER — Encounter (INDEPENDENT_AMBULATORY_CARE_PROVIDER_SITE_OTHER): Payer: Self-pay | Admitting: Orthopaedic Surgery

## 2018-01-29 ENCOUNTER — Ambulatory Visit (INDEPENDENT_AMBULATORY_CARE_PROVIDER_SITE_OTHER): Payer: BLUE CROSS/BLUE SHIELD | Admitting: Orthopaedic Surgery

## 2018-01-29 ENCOUNTER — Ambulatory Visit (INDEPENDENT_AMBULATORY_CARE_PROVIDER_SITE_OTHER): Payer: Self-pay

## 2018-01-29 DIAGNOSIS — M25561 Pain in right knee: Secondary | ICD-10-CM | POA: Diagnosis not present

## 2018-01-29 NOTE — Progress Notes (Signed)
Office Visit Note   Patient: Christina Howell           Date of Birth: 10/06/1955           MRN: [id number] Visit Date: 01/29/2018              Requested by: Catha Gosselin, MD [address] [place], [place] [address] PCP: Catha Gosselin, MD   Assessment & Plan: Visit Diagnoses:  1. Acute pain of right knee     Plan: Impression is acute right medial meniscus tear.  We will obtain an MRI to further assess this.  She will follow-up with [place] once this is been completed.  Follow-Up Instructions: Return in about 10 days (around 02/08/2018) for mri review.   Orders:  Orders Placed This Encounter  Procedures  . XR KNEE 3 VIEW RIGHT  . MR Knee Right w/o contrast   No orders of the defined types were placed in this encounter.     Procedures: No procedures performed   Clinical Data: No additional findings.   Subjective: Chief Complaint  Patient presents with  . Right Knee - Pain    HPI patient is a pleasant 62 year old female who presents to our clinic today with right knee pain.  She was hiking in [place] about a month ago when she was pulled up by a friend as she was trying to climb a rock.  At that point, she twisted the right knee.  She had immediate pain and had a hard time bearing weight.  She was seen in urgent care setting where x-rays were obtained.  These were negative for fracture or other acute findings.  Her PCP has since sent her to physical therapy.  She has not noticed any improvement of symptoms.  The pain she has is to the medial aspect.  No locking or catching.  No instability.  Pain appears to be worse at the end of the day being on her feet.  She is tried naproxen and muscle relaxers without relief of symptoms.  No previous cortisone injection or surgical intervention.  Review of Systems as detailed in HPI.  All others reviewed and are negative   Objective: Vital Signs: There were no vitals taken for this visit.  Physical Exam well developed and  well-nourished female no acute distress.  Alert and oriented x3.  Ortho Exam examination of the right knee reveals trace effusion.  Range of motion 0 to 100 degrees.  Exquisite tenderness medial meniscus.  She is stable valgus and varus stress.  She is neurovascularly intact distally.  Specialty Comments:  No specialty comments available.  Imaging: Xr Knee 3 View Right  Result Date: 01/29/2018 No acute or structural abnormalities    PMFS History: Patient Active Problem List   Diagnosis Date Noted  . Acute pain of right knee 01/29/2018  . Throat congestion 09/07/2010  . Chest tightness 07/22/2010   Past Medical History:  Diagnosis Date  . Eczema    dermatologist--- Dr. Lonni Fix   . Heel spur 2009   by x-ray  . Herniated disc    L4-L5 and T11-T12----has seen neurosurgeon Dr. Venetia Maxon  . Plantar fasciitis   . Varicose vein     Family History  Problem Relation Age of Onset  . Emphysema Paternal Grandmother   . Rheum arthritis Father     Past Surgical History:  Procedure Laterality Date  . TOTAL ABDOMINAL HYSTERECTOMY    . VARICOSE VEIN SURGERY    . WISDOM TOOTH EXTRACTION  Social History   Occupational History  . Occupation: Magazine features editorTeacher    Employer: [employer]  Tobacco Use  . Smoking status: Never Smoker  . Smokeless tobacco: Never Used  Substance and Sexual Activity  . Alcohol use: Not on file  . Drug use: Not on file  . Sexual activity: Not on file

## 2018-01-30 DIAGNOSIS — M25561 Pain in right knee: Secondary | ICD-10-CM | POA: Diagnosis not present

## 2018-01-30 DIAGNOSIS — M6281 Muscle weakness (generalized): Secondary | ICD-10-CM | POA: Diagnosis not present

## 2018-01-30 DIAGNOSIS — R262 Difficulty in walking, not elsewhere classified: Secondary | ICD-10-CM | POA: Diagnosis not present

## 2018-01-30 DIAGNOSIS — M79604 Pain in right leg: Secondary | ICD-10-CM | POA: Diagnosis not present

## 2018-01-31 DIAGNOSIS — M79604 Pain in right leg: Secondary | ICD-10-CM | POA: Diagnosis not present

## 2018-01-31 DIAGNOSIS — R262 Difficulty in walking, not elsewhere classified: Secondary | ICD-10-CM | POA: Diagnosis not present

## 2018-01-31 DIAGNOSIS — M6281 Muscle weakness (generalized): Secondary | ICD-10-CM | POA: Diagnosis not present

## 2018-01-31 DIAGNOSIS — M25561 Pain in right knee: Secondary | ICD-10-CM | POA: Diagnosis not present

## 2018-02-01 DIAGNOSIS — K648 Other hemorrhoids: Secondary | ICD-10-CM | POA: Diagnosis not present

## 2018-02-01 DIAGNOSIS — Z1211 Encounter for screening for malignant neoplasm of colon: Secondary | ICD-10-CM | POA: Diagnosis not present

## 2018-02-04 DIAGNOSIS — M6281 Muscle weakness (generalized): Secondary | ICD-10-CM | POA: Diagnosis not present

## 2018-02-04 DIAGNOSIS — M25561 Pain in right knee: Secondary | ICD-10-CM | POA: Diagnosis not present

## 2018-02-04 DIAGNOSIS — M79604 Pain in right leg: Secondary | ICD-10-CM | POA: Diagnosis not present

## 2018-02-04 DIAGNOSIS — R262 Difficulty in walking, not elsewhere classified: Secondary | ICD-10-CM | POA: Diagnosis not present

## 2018-02-05 DIAGNOSIS — J209 Acute bronchitis, unspecified: Secondary | ICD-10-CM | POA: Diagnosis not present

## 2018-02-06 ENCOUNTER — Ambulatory Visit
Admission: RE | Admit: 2018-02-06 | Discharge: 2018-02-06 | Disposition: A | Discharge status: Home / Self Care | Payer: BLUE CROSS/BLUE SHIELD | Source: Ambulatory Visit | Attending: Orthopaedic Surgery | Admitting: Orthopaedic Surgery

## 2018-02-06 DIAGNOSIS — M6281 Muscle weakness (generalized): Secondary | ICD-10-CM | POA: Diagnosis not present

## 2018-02-06 DIAGNOSIS — M79604 Pain in right leg: Secondary | ICD-10-CM | POA: Diagnosis not present

## 2018-02-06 DIAGNOSIS — R262 Difficulty in walking, not elsewhere classified: Secondary | ICD-10-CM | POA: Diagnosis not present

## 2018-02-06 DIAGNOSIS — M25561 Pain in right knee: Secondary | ICD-10-CM | POA: Diagnosis not present

## 2018-02-08 ENCOUNTER — Ambulatory Visit (INDEPENDENT_AMBULATORY_CARE_PROVIDER_SITE_OTHER): Payer: BLUE CROSS/BLUE SHIELD | Admitting: Orthopaedic Surgery

## 2018-02-08 ENCOUNTER — Encounter (INDEPENDENT_AMBULATORY_CARE_PROVIDER_SITE_OTHER): Payer: Self-pay | Admitting: Orthopaedic Surgery

## 2018-02-08 DIAGNOSIS — M79604 Pain in right leg: Secondary | ICD-10-CM | POA: Diagnosis not present

## 2018-02-08 DIAGNOSIS — M6281 Muscle weakness (generalized): Secondary | ICD-10-CM | POA: Diagnosis not present

## 2018-02-08 DIAGNOSIS — S83241A Other tear of medial meniscus, current injury, right knee, initial encounter: Secondary | ICD-10-CM | POA: Diagnosis not present

## 2018-02-08 DIAGNOSIS — R262 Difficulty in walking, not elsewhere classified: Secondary | ICD-10-CM | POA: Diagnosis not present

## 2018-02-08 DIAGNOSIS — M25561 Pain in right knee: Secondary | ICD-10-CM | POA: Diagnosis not present

## 2018-02-08 MED ORDER — NAPROXEN 500 MG PO TABS: 500.0000 mg | ORAL_TABLET | Freq: Two times a day (BID) | ORAL | 3 refills | Status: DC

## 2018-02-08 NOTE — Progress Notes (Addendum)
Office Visit Note   Patient: Christina Howell           Date of Birth: 22-Jun-1955           MRN: [id number] Visit Date: 02/08/2018              Requested by: Catha Gosselin, MD [address] [place], [place] [address] PCP: Catha Gosselin, MD   Assessment & Plan: Visit Diagnoses:  1. Acute medial meniscus tear of right knee, initial encounter     Plan: MRI is consistent with a mild medial meniscal root tear with minimal extrusion and a corresponding medial tibial plateau insufficiency fracture with significant edema.  These findings were discussed with the patient.  Recommend protected weightbearing as needed with a crutch or cane.  Activity modification.  Relative rest.  We will get her fitted for a medial unloader brace.  She will take calcium and vitamin D supplements.  I would like to recheck her in 6 weeks.  Avoid any impact activities.  Follow-Up Instructions: Return in about 6 weeks (around 03/22/2018).   Orders:  No orders of the defined types were placed in this encounter.  Meds ordered this encounter  Medications  . naproxen (NAPROSYN) 500 MG tablet    Sig: Take 1 tablet (500 mg total) by mouth 2 (two) times daily with a meal.    Dispense:  30 tablet    Refill:  3      Procedures: No procedures performed   Clinical Data: No additional findings.   Subjective: Chief Complaint  Patient presents with  . Right Knee - Pain, Follow-up    [name] returns today for MRI review of her right knee.  She states that her pain waxes and wanes in terms of intensity.   Review of Systems  Constitutional: Negative.   HENT: Negative.   Eyes: Negative.   Respiratory: Negative.   Cardiovascular: Negative.   Endocrine: Negative.   Musculoskeletal: Negative.   Neurological: Negative.   Hematological: Negative.   Psychiatric/Behavioral: Negative.   All other systems reviewed and are negative.    Objective: Vital Signs: There were no vitals taken for this visit.  Physical  Exam Vitals signs and nursing note reviewed.  Constitutional:      Appearance: She is well-developed.  Pulmonary:     Effort: Pulmonary effort is normal.  Skin:    General: Skin is warm.     Capillary Refill: Capillary refill takes less than 2 seconds.  Neurological:     Mental Status: She is alert and oriented to person, place, and time.  Psychiatric:        Behavior: Behavior normal.        Thought Content: Thought content normal.        Judgment: Judgment normal.     Ortho Exam Right knee exam shows no joint effusion.  She has medial joint line tenderness as well as tenderness along the proximal medial tibial plateau. Specialty Comments:  No specialty comments available.  Imaging: No results found.   PMFS History: Patient Active Problem List   Diagnosis Date Noted  . Acute pain of right knee 01/29/2018  . Throat congestion 09/07/2010  . Chest tightness 07/22/2010   Past Medical History:  Diagnosis Date  . Eczema    dermatologist--- Dr. Lonni Fix   . Heel spur 2009   by x-ray  . Herniated disc    L4-L5 and T11-T12----has seen neurosurgeon Dr. Venetia Maxon  . Plantar fasciitis   . Varicose vein  Family History  Problem Relation Age of Onset  . Emphysema Paternal Grandmother   . Rheum arthritis Father     Past Surgical History:  Procedure Laterality Date  . TOTAL ABDOMINAL HYSTERECTOMY    . VARICOSE VEIN SURGERY    . WISDOM TOOTH EXTRACTION     Social History   Occupational History  . Occupation: Magazine features editorTeacher    Employer: [employer]  Tobacco Use  . Smoking status: Never Smoker  . Smokeless tobacco: Never Used  Substance and Sexual Activity  . Alcohol use: Not on file  . Drug use: Not on file  . Sexual activity: Not on file

## 2018-02-11 DIAGNOSIS — M25561 Pain in right knee: Secondary | ICD-10-CM | POA: Diagnosis not present

## 2018-02-11 DIAGNOSIS — R262 Difficulty in walking, not elsewhere classified: Secondary | ICD-10-CM | POA: Diagnosis not present

## 2018-02-11 DIAGNOSIS — M79604 Pain in right leg: Secondary | ICD-10-CM | POA: Diagnosis not present

## 2018-02-11 DIAGNOSIS — M6281 Muscle weakness (generalized): Secondary | ICD-10-CM | POA: Diagnosis not present

## 2018-02-13 DIAGNOSIS — M79604 Pain in right leg: Secondary | ICD-10-CM | POA: Diagnosis not present

## 2018-02-13 DIAGNOSIS — R262 Difficulty in walking, not elsewhere classified: Secondary | ICD-10-CM | POA: Diagnosis not present

## 2018-02-13 DIAGNOSIS — M6281 Muscle weakness (generalized): Secondary | ICD-10-CM | POA: Diagnosis not present

## 2018-02-13 DIAGNOSIS — M25561 Pain in right knee: Secondary | ICD-10-CM | POA: Diagnosis not present

## 2018-02-14 ENCOUNTER — Telehealth (INDEPENDENT_AMBULATORY_CARE_PROVIDER_SITE_OTHER): Payer: Self-pay

## 2018-02-14 NOTE — Telephone Encounter (Signed)
Error

## 2018-02-14 NOTE — Telephone Encounter (Signed)
Need Dx added for MMT to last note for the knee brace  Fax to [phone] ATTN: Samuel BoucheLucas

## 2018-02-14 NOTE — Telephone Encounter (Signed)
done

## 2018-02-14 NOTE — Telephone Encounter (Signed)
See message.

## 2018-02-15 DIAGNOSIS — M6281 Muscle weakness (generalized): Secondary | ICD-10-CM | POA: Diagnosis not present

## 2018-02-15 DIAGNOSIS — R262 Difficulty in walking, not elsewhere classified: Secondary | ICD-10-CM | POA: Diagnosis not present

## 2018-02-15 DIAGNOSIS — M79604 Pain in right leg: Secondary | ICD-10-CM | POA: Diagnosis not present

## 2018-02-15 DIAGNOSIS — M25561 Pain in right knee: Secondary | ICD-10-CM | POA: Diagnosis not present

## 2018-02-15 NOTE — Telephone Encounter (Signed)
FAXED

## 2018-02-17 DIAGNOSIS — R262 Difficulty in walking, not elsewhere classified: Secondary | ICD-10-CM | POA: Diagnosis not present

## 2018-02-17 DIAGNOSIS — M25561 Pain in right knee: Secondary | ICD-10-CM | POA: Diagnosis not present

## 2018-02-17 DIAGNOSIS — M6281 Muscle weakness (generalized): Secondary | ICD-10-CM | POA: Diagnosis not present

## 2018-02-17 DIAGNOSIS — M79604 Pain in right leg: Secondary | ICD-10-CM | POA: Diagnosis not present

## 2018-02-18 DIAGNOSIS — M1711 Unilateral primary osteoarthritis, right knee: Secondary | ICD-10-CM | POA: Diagnosis not present

## 2018-02-19 DIAGNOSIS — M79604 Pain in right leg: Secondary | ICD-10-CM | POA: Diagnosis not present

## 2018-02-19 DIAGNOSIS — M25561 Pain in right knee: Secondary | ICD-10-CM | POA: Diagnosis not present

## 2018-02-19 DIAGNOSIS — M6281 Muscle weakness (generalized): Secondary | ICD-10-CM | POA: Diagnosis not present

## 2018-02-19 DIAGNOSIS — R262 Difficulty in walking, not elsewhere classified: Secondary | ICD-10-CM | POA: Diagnosis not present

## 2018-02-21 DIAGNOSIS — R05 Cough: Secondary | ICD-10-CM | POA: Diagnosis not present

## 2018-02-22 DIAGNOSIS — M25561 Pain in right knee: Secondary | ICD-10-CM | POA: Diagnosis not present

## 2018-02-22 DIAGNOSIS — R262 Difficulty in walking, not elsewhere classified: Secondary | ICD-10-CM | POA: Diagnosis not present

## 2018-02-22 DIAGNOSIS — M79604 Pain in right leg: Secondary | ICD-10-CM | POA: Diagnosis not present

## 2018-02-22 DIAGNOSIS — M6281 Muscle weakness (generalized): Secondary | ICD-10-CM | POA: Diagnosis not present

## 2018-03-22 ENCOUNTER — Ambulatory Visit (INDEPENDENT_AMBULATORY_CARE_PROVIDER_SITE_OTHER): Payer: BLUE CROSS/BLUE SHIELD | Admitting: Orthopaedic Surgery

## 2018-05-13 ENCOUNTER — Encounter (INDEPENDENT_AMBULATORY_CARE_PROVIDER_SITE_OTHER): Payer: Self-pay | Admitting: Family Medicine

## 2018-05-13 ENCOUNTER — Other Ambulatory Visit: Payer: Self-pay

## 2018-05-13 ENCOUNTER — Ambulatory Visit (INDEPENDENT_AMBULATORY_CARE_PROVIDER_SITE_OTHER): Payer: BLUE CROSS/BLUE SHIELD

## 2018-05-13 ENCOUNTER — Ambulatory Visit (INDEPENDENT_AMBULATORY_CARE_PROVIDER_SITE_OTHER): Payer: BLUE CROSS/BLUE SHIELD | Admitting: Family Medicine

## 2018-05-13 DIAGNOSIS — M25461 Effusion, right knee: Secondary | ICD-10-CM | POA: Diagnosis not present

## 2018-05-13 DIAGNOSIS — M25552 Pain in left hip: Secondary | ICD-10-CM | POA: Diagnosis not present

## 2018-05-13 MED ORDER — CYCLOBENZAPRINE HCL 10 MG PO TABS: 10.0000 mg | ORAL_TABLET | Freq: Three times a day (TID) | ORAL | 1 refills | Status: AC | PRN

## 2018-05-13 MED ORDER — METHYLPREDNISOLONE ACETATE 40 MG/ML IJ SUSP
40.0000 mg | INTRAMUSCULAR | Status: AC | PRN
  Administered 2018-05-13: 40 mg via INTRA_ARTICULAR

## 2018-05-13 MED ORDER — LIDOCAINE HCL 1 % IJ SOLN
5.0000 mL | INTRAMUSCULAR | Status: AC | PRN
  Administered 2018-05-13: 5 mL

## 2018-05-13 NOTE — Progress Notes (Signed)
Office Visit Note   Patient: Christina Howell           Date of Birth: 27-Aug-1955           MRN: [id number] Visit Date: 05/13/2018 Requested by: Catha Gosselin, MD [address] [place], [place] [address] PCP: Catha Gosselin, MD  Subjective: Chief Complaint  Patient presents with  . Left Hip - Pain    Pain in the hip x 1 month. NKI. Pain in the lateral and posterior hip. No groin pain.  . Right Knee - Pain    HPI: She is here with left hip pain.  Symptoms started about a month ago, no injury.  Pain on the posterior lateral aspect when walking, and especially at the end of the day when trying to sleep.  She cannot think of any injury.  Denies any groin pain.  She thinks she had similar problems in the past that resolved with cortisone injection.  She is doing lateral leg raises for strengthening for her knees already.  She takes vitamin D and other nutritional supplements.  She also developed recurrent swelling in her right knee recently.  It does not hurt anymore, but she wanted me to check it.              ROS: Denies any fevers or chills.  All other systems were reviewed and are negative.  Objective: Vital Signs: There were no vitals taken for this visit.  Physical Exam:  General:  Alert and oriented, in no acute distress. Pulm:  Breathing unlabored. Psy:  Normal mood, congruent affect. Skin: No visible rash. Left hip: Good range of motion, no pain with passive hip flexion and internal rotation.  She is tender on the posterior aspect of the greater trochanter and a little bit in the region of the piriformis.  No significant pain with piriformis stretch.  No pain with resisted hip strength testing. Right knee: 1+ effusion, no warmth or erythema.  Slight tenderness medial joint line, no pain with McMurray's.  Imaging: X-rays left hip: Very early degenerative periarticular spurring in her hip joint.  No sign of stress fracture, no soft tissue calcifications.  Assessment & Plan: 1.   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 -Discussed various options with patient and she wants to try another injection.  Physical therapy if symptoms persist.  2.  Right knee effusion with underlying meniscus tear -Reassurance, as long as pain remains minimal she will continue with home exercises and follow-up as needed.     Procedures: Large Joint Inj: L greater trochanter on 05/13/2018 10:44 AM Indications: pain Details: 22 G 3.5 in needle Medications: 5 mL lidocaine 1 %; 40 mg methylPREDNISolone acetate 40 MG/ML Consent was given by the patient.      No notes on file     PMFS History: Patient Active Problem List   Diagnosis Date Noted  . Acute pain of right knee 01/29/2018  . Throat congestion 09/07/2010  . Chest tightness 07/22/2010   Past Medical History:  Diagnosis Date  . Eczema    dermatologist--- Dr. Lonni Fix   . Heel spur 2009   by x-ray  . Herniated disc    L4-L5 and T11-T12----has seen neurosurgeon Dr. Venetia Maxon  . Plantar fasciitis   . Varicose vein     Family History  Problem Relation Age of Onset  . Emphysema Paternal Grandmother   . Rheum arthritis Father     Past Surgical History:  Procedure Laterality Date  .  TOTAL ABDOMINAL HYSTERECTOMY    . VARICOSE VEIN SURGERY    . WISDOM TOOTH EXTRACTION     Social History   Occupational History  . Occupation: Magazine features editor: [employer]  Tobacco Use  . Smoking status: Never Smoker  . Smokeless tobacco: Never Used  Substance and Sexual Activity  . Alcohol use: Not on file  . Drug use: Not on file  . Sexual activity: Not on file

## 2018-06-13 ENCOUNTER — Ambulatory Visit (INDEPENDENT_AMBULATORY_CARE_PROVIDER_SITE_OTHER): Payer: BLUE CROSS/BLUE SHIELD | Admitting: Family Medicine

## 2018-06-13 ENCOUNTER — Encounter (INDEPENDENT_AMBULATORY_CARE_PROVIDER_SITE_OTHER): Payer: Self-pay | Admitting: Family Medicine

## 2018-06-13 ENCOUNTER — Other Ambulatory Visit: Payer: Self-pay

## 2018-06-13 DIAGNOSIS — M25552 Pain in left hip: Secondary | ICD-10-CM | POA: Diagnosis not present

## 2018-06-13 MED ORDER — METHYLPREDNISOLONE ACETATE 40 MG/ML IJ SUSP: 40.0000 mg | Freq: Once | INTRAMUSCULAR | Status: DC

## 2018-06-13 NOTE — Progress Notes (Signed)
Office Visit Note   Patient: Christina Howell           Date of Birth: [birth year]           MRN: [id number] Visit Date: 06/13/2018 Requested by: Catha Gosselin, MD [address] [place], [place] [address] PCP: Catha Gosselin, MD  Subjective: Chief Complaint  Patient presents with  . Left Hip - Pain    Requests cortisone injection. Last injection took 2 weeks to kick in and it lasted 1 week.    HPI: She is here with recurrent left hip pain.  Last month we injected her greater trochanter.  She states that it took about 2 weeks before it started helping, and then it only lasted about a week.  Her pain has recurred on the posterior lateral aspect with no radiation into the groin area, no pain down the leg and no low back pain.  Pain is worse when bearing weight.              ROS: Denies fevers, chills, respiratory symptoms.  She is not diabetic, no history of hypertension or asthma.  All other systems were reviewed and are negative.  Objective: Vital Signs: There were no vitals taken for this visit.  Physical Exam:  General:  Alert and oriented, in no acute distress. Pulm:  Breathing unlabored. Psy:  Normal mood, congruent affect. Skin: No rash on her skin. Left hip: No pain with internal rotation.  She is tender on the posterior lateral greater trochanter.  Negative stork test, negative piriformis stretch.  Imaging: Limited diagnostic ultrasound: There is bursal swelling but no hyperemia on power Doppler imaging.  No tearing of the gluteus medius or minimus tendons visible.  Assessment & Plan: 1.  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 -Discussed with patient, she wants to try 1 more injection.  If not improved next week then possibly is able therapy.  If still no relief, then MRI hip and MRI lumbar spine if hip MRI is negative.     Procedures: Left hip injection: After sterile prep with  Betadine, injected 8 cc 1% lidocaine without epinephrine and 40 mg methylprednisolone using a 22-gauge spinal needle passing the needle into the greater trochanteric bursa.  She had slight improvement in pain during the anesthetic phase but not complete.   PMFS History: Patient Active Problem List   Diagnosis Date Noted  . Acute pain of right knee 01/29/2018  . Throat congestion 09/07/2010  . Chest tightness 07/22/2010   Past Medical History:  Diagnosis Date  . Eczema    dermatologist--- Dr. Lonni Fix   . Heel spur 2009   by x-ray  . Herniated disc    L4-L5 and T11-T12----has seen neurosurgeon Dr. Venetia Maxon  . Plantar fasciitis   . Varicose vein     Family History  Problem Relation Age of Onset  . Emphysema Paternal Grandmother   . Rheum arthritis Father     Past Surgical History:  Procedure Laterality Date  . TOTAL ABDOMINAL HYSTERECTOMY    . VARICOSE VEIN SURGERY    . WISDOM TOOTH EXTRACTION     Social History   Occupational History  . Occupation: Magazine features editor: [employer]  Tobacco Use  . Smoking status: Never Smoker  . Smokeless tobacco: Never Used  Substance and Sexual Activity  . Alcohol use: Not on file  . Drug use: Not on file  . Sexual activity:  Not on file

## 2018-07-02 DIAGNOSIS — L570 Actinic keratosis: Secondary | ICD-10-CM | POA: Diagnosis not present

## 2018-07-02 DIAGNOSIS — L821 Other seborrheic keratosis: Secondary | ICD-10-CM | POA: Diagnosis not present

## 2018-07-19 ENCOUNTER — Ambulatory Visit (INDEPENDENT_AMBULATORY_CARE_PROVIDER_SITE_OTHER): Payer: BLUE CROSS/BLUE SHIELD | Admitting: Family Medicine

## 2018-07-19 ENCOUNTER — Ambulatory Visit: Payer: Self-pay

## 2018-07-19 ENCOUNTER — Encounter: Payer: Self-pay | Admitting: Family Medicine

## 2018-07-19 ENCOUNTER — Other Ambulatory Visit: Payer: Self-pay

## 2018-07-19 DIAGNOSIS — M545 Low back pain: Secondary | ICD-10-CM | POA: Diagnosis not present

## 2018-07-19 DIAGNOSIS — M25552 Pain in left hip: Secondary | ICD-10-CM

## 2018-07-19 MED ORDER — BACLOFEN 10 MG PO TABS: 5.0000 mg | ORAL_TABLET | Freq: Three times a day (TID) | ORAL | 3 refills | Status: DC | PRN

## 2018-07-19 MED ORDER — NABUMETONE 750 MG PO TABS: 750.0000 mg | ORAL_TABLET | Freq: Two times a day (BID) | ORAL | 6 refills | Status: DC | PRN

## 2018-07-19 NOTE — Progress Notes (Signed)
Office Visit Note   Patient: Christina Howell           Date of Birth: Dec 06, 1955           MRN: [id number] Visit Date: 07/19/2018 Requested by: Catha GosselinLittle, Kevin, MD [address] [place], [place] [id number] PCP: Catha GosselinLittle, Kevin, MD  Subjective: Chief Complaint  Patient presents with  . Left Hip - Pain    Pain lateral hip, shooting down into the foot, x 2 weeks. Keeps her awake at night. Not bothering her much during the day. Unable to sleep on her left side x approximately 1 month.    HPI: She is here with persistent left hip pain.  We have done 2 injections now with only very short-term relief at best.  She states that during the day it does not bother her as much, but at night whenever she lies down to sleep, pain becomes severe and is now radiating down the leg toward the foot.  She feels like it is a "nerve pain".  Denies any lower back pain, has not noticed any weakness or numbness in her leg.  In review of her records, on June 26, 2004 she had a lumbar MRI scan showing a broad-based left posterior lateral disc herniation at L4-5 causing left lateral recess stenosis.              ROS: Denies fevers or chills.  Denies bowel or bladder dysfunction.  All other systems were reviewed and are negative.  Objective: Vital Signs: There were no vitals taken for this visit.  Physical Exam:  General:  Alert and oriented, in no acute distress. Pulm:  Breathing unlabored. Psy:  Normal mood, congruent affect. Skin: No rash on her skin. Left hip: Low back is not tender to palpation.  Stork test is negative.  Straight leg raise is negative.  There is no tenderness in the sciatic notch, moderate tenderness over the left greater trochanter.  Hip range of motion is normal and pain-free.  Lower extremity strength and reflexes are normal.  Imaging: X-rays lumbar spine: Normal anatomic alignment.  She does have moderate degenerative disc disease at L4-5, and mild to moderate at L5-S1.  She has facet  arthropathy at multiple levels.  No sign of compression deformity or neoplasm.  SI joints are unremarkable, hip joints are mostly well-preserved.  Some early periarticular spurring on the left.    Assessment & Plan: 1.  Persistent left lateral hip pain, question due to lumbar foraminal stenosis. -Trial of chiropractic per Dr. Mayford KnifeWilliams. - PT if still no relief. - Relafen, baclofen as needed. - MRI lumbar spine if fails conservative Tx.     Procedures: No procedures performed  No notes on file     PMFS History: Patient Active Problem List   Diagnosis Date Noted  . Acute pain of right knee 01/29/2018  . Throat congestion 09/07/2010  . Chest tightness 07/22/2010   Past Medical History:  Diagnosis Date  . Eczema    dermatologist--- Dr. Lonni FixNolan   . Heel spur 2009   by x-ray  . Herniated disc    L4-L5 and T11-T12----has seen neurosurgeon Dr. Venetia MaxonStern  . Plantar fasciitis   . Varicose vein     Family History  Problem Relation Age of Onset  . Emphysema Paternal Grandmother   . Rheum arthritis Father     Past Surgical History:  Procedure Laterality Date  . TOTAL ABDOMINAL HYSTERECTOMY    . VARICOSE VEIN SURGERY    .  WISDOM TOOTH EXTRACTION     Social History   Occupational History  . Occupation: Magazine features editor: [employer]  Tobacco Use  . Smoking status: Never Smoker  . Smokeless tobacco: Never Used  Substance and Sexual Activity  . Alcohol use: Not on file  . Drug use: Not on file  . Sexual activity: Not on file

## 2018-07-28 ENCOUNTER — Telehealth: Payer: Self-pay | Admitting: Cardiology

## 2018-07-28 NOTE — Telephone Encounter (Signed)
Spoke with patient regarding recent office visit 5/22 and possible Covid exposure.  Patient is not currently experiencing any symptoms.  Offered free testing, she wants to think about it.  Supplied number [phone] for her to call if she decides she wants to be tested.

## 2018-07-30 ENCOUNTER — Telehealth: Payer: Self-pay

## 2018-07-30 DIAGNOSIS — M5442 Lumbago with sciatica, left side: Secondary | ICD-10-CM | POA: Diagnosis not present

## 2018-07-30 DIAGNOSIS — M9905 Segmental and somatic dysfunction of pelvic region: Secondary | ICD-10-CM | POA: Diagnosis not present

## 2018-07-30 DIAGNOSIS — M25552 Pain in left hip: Secondary | ICD-10-CM | POA: Diagnosis not present

## 2018-07-30 DIAGNOSIS — M9903 Segmental and somatic dysfunction of lumbar region: Secondary | ICD-10-CM | POA: Diagnosis not present

## 2018-07-30 NOTE — Telephone Encounter (Signed)
Patient aware CD is ready for pickup °

## 2018-07-30 NOTE — Telephone Encounter (Signed)
Marchelle Folks with [facility] would like to know if she faxes over a release form from patient, could we release her x-rays.  Stated that patient is currently there.  Cb# is [phone].  Please advise. Thank you.

## 2018-07-30 NOTE — Telephone Encounter (Signed)
I called and advised Marchelle Folks of this. Can you make a CD and call the patient when this is ready for pickup? She has another appointment with Dr. Mayford Knife tomorrow, so she would like to have it for that visit.

## 2018-07-30 NOTE — Telephone Encounter (Signed)
We can make CD with images for patient to take.

## 2018-07-30 NOTE — Telephone Encounter (Signed)
Lauren, can you help with this?

## 2018-07-31 DIAGNOSIS — M5442 Lumbago with sciatica, left side: Secondary | ICD-10-CM | POA: Diagnosis not present

## 2018-07-31 DIAGNOSIS — M9905 Segmental and somatic dysfunction of pelvic region: Secondary | ICD-10-CM | POA: Diagnosis not present

## 2018-07-31 DIAGNOSIS — M9903 Segmental and somatic dysfunction of lumbar region: Secondary | ICD-10-CM | POA: Diagnosis not present

## 2018-07-31 DIAGNOSIS — M25552 Pain in left hip: Secondary | ICD-10-CM | POA: Diagnosis not present

## 2018-08-02 DIAGNOSIS — M5442 Lumbago with sciatica, left side: Secondary | ICD-10-CM | POA: Diagnosis not present

## 2018-08-02 DIAGNOSIS — M9903 Segmental and somatic dysfunction of lumbar region: Secondary | ICD-10-CM | POA: Diagnosis not present

## 2018-08-02 DIAGNOSIS — M25552 Pain in left hip: Secondary | ICD-10-CM | POA: Diagnosis not present

## 2018-08-02 DIAGNOSIS — M9905 Segmental and somatic dysfunction of pelvic region: Secondary | ICD-10-CM | POA: Diagnosis not present

## 2018-08-05 DIAGNOSIS — M9905 Segmental and somatic dysfunction of pelvic region: Secondary | ICD-10-CM | POA: Diagnosis not present

## 2018-08-05 DIAGNOSIS — M9903 Segmental and somatic dysfunction of lumbar region: Secondary | ICD-10-CM | POA: Diagnosis not present

## 2018-08-05 DIAGNOSIS — M25552 Pain in left hip: Secondary | ICD-10-CM | POA: Diagnosis not present

## 2018-08-05 DIAGNOSIS — M5442 Lumbago with sciatica, left side: Secondary | ICD-10-CM | POA: Diagnosis not present

## 2018-08-07 DIAGNOSIS — M5442 Lumbago with sciatica, left side: Secondary | ICD-10-CM | POA: Diagnosis not present

## 2018-08-07 DIAGNOSIS — M9903 Segmental and somatic dysfunction of lumbar region: Secondary | ICD-10-CM | POA: Diagnosis not present

## 2018-08-07 DIAGNOSIS — M9905 Segmental and somatic dysfunction of pelvic region: Secondary | ICD-10-CM | POA: Diagnosis not present

## 2018-08-07 DIAGNOSIS — M25552 Pain in left hip: Secondary | ICD-10-CM | POA: Diagnosis not present

## 2018-08-08 DIAGNOSIS — M9905 Segmental and somatic dysfunction of pelvic region: Secondary | ICD-10-CM | POA: Diagnosis not present

## 2018-08-08 DIAGNOSIS — M5442 Lumbago with sciatica, left side: Secondary | ICD-10-CM | POA: Diagnosis not present

## 2018-08-08 DIAGNOSIS — M25552 Pain in left hip: Secondary | ICD-10-CM | POA: Diagnosis not present

## 2018-08-08 DIAGNOSIS — M9903 Segmental and somatic dysfunction of lumbar region: Secondary | ICD-10-CM | POA: Diagnosis not present

## 2018-08-12 DIAGNOSIS — M9905 Segmental and somatic dysfunction of pelvic region: Secondary | ICD-10-CM | POA: Diagnosis not present

## 2018-08-12 DIAGNOSIS — M5442 Lumbago with sciatica, left side: Secondary | ICD-10-CM | POA: Diagnosis not present

## 2018-08-12 DIAGNOSIS — M9903 Segmental and somatic dysfunction of lumbar region: Secondary | ICD-10-CM | POA: Diagnosis not present

## 2018-08-12 DIAGNOSIS — M25552 Pain in left hip: Secondary | ICD-10-CM | POA: Diagnosis not present

## 2018-08-14 ENCOUNTER — Telehealth: Payer: Self-pay | Admitting: Family Medicine

## 2018-08-14 DIAGNOSIS — M5135 Other intervertebral disc degeneration, thoracolumbar region: Secondary | ICD-10-CM | POA: Diagnosis not present

## 2018-08-14 DIAGNOSIS — M5442 Lumbago with sciatica, left side: Secondary | ICD-10-CM | POA: Diagnosis not present

## 2018-08-14 DIAGNOSIS — M9905 Segmental and somatic dysfunction of pelvic region: Secondary | ICD-10-CM | POA: Diagnosis not present

## 2018-08-14 DIAGNOSIS — Z79899 Other long term (current) drug therapy: Secondary | ICD-10-CM | POA: Diagnosis not present

## 2018-08-14 DIAGNOSIS — B351 Tinea unguium: Secondary | ICD-10-CM | POA: Diagnosis not present

## 2018-08-14 DIAGNOSIS — M25552 Pain in left hip: Secondary | ICD-10-CM | POA: Diagnosis not present

## 2018-08-14 DIAGNOSIS — M9903 Segmental and somatic dysfunction of lumbar region: Secondary | ICD-10-CM | POA: Diagnosis not present

## 2018-08-14 NOTE — Telephone Encounter (Signed)
07/19/2018 ov note faxed to Dr. Lennette Bihari Little's office [phone], ph [phone]

## 2018-08-15 DIAGNOSIS — M9903 Segmental and somatic dysfunction of lumbar region: Secondary | ICD-10-CM | POA: Diagnosis not present

## 2018-08-15 DIAGNOSIS — M25552 Pain in left hip: Secondary | ICD-10-CM | POA: Diagnosis not present

## 2018-08-15 DIAGNOSIS — M5442 Lumbago with sciatica, left side: Secondary | ICD-10-CM | POA: Diagnosis not present

## 2018-08-15 DIAGNOSIS — M9905 Segmental and somatic dysfunction of pelvic region: Secondary | ICD-10-CM | POA: Diagnosis not present

## 2018-08-16 ENCOUNTER — Telehealth: Payer: Self-pay | Admitting: Family Medicine

## 2018-08-16 DIAGNOSIS — M25552 Pain in left hip: Secondary | ICD-10-CM

## 2018-08-16 MED ORDER — TRAMADOL HCL 50 MG PO TABS: 50.0000 mg | ORAL_TABLET | Freq: Four times a day (QID) | ORAL | 0 refills | Status: DC | PRN

## 2018-08-16 NOTE — Telephone Encounter (Signed)
Referral ordered

## 2018-08-16 NOTE — Telephone Encounter (Signed)
Patient called asked if Dr Junius Roads would refer her for (PT). Patient said she would like to go to [facility]. Patient also said she need a stronger pain medicine but not an opioid. The number to contact patient is [phone]

## 2018-08-16 NOTE — Telephone Encounter (Signed)
Tramadol Rx sent 

## 2018-08-16 NOTE — Telephone Encounter (Signed)
Left full message on the patient's mobile voice mail - Rx sent in to pharmacy and referral to [facility] has been placed.

## 2018-08-16 NOTE — Telephone Encounter (Signed)
Please also advise on the medication request.

## 2018-08-16 NOTE — Telephone Encounter (Signed)
She was last given nabumetone and baclofen. Please advise.

## 2018-08-16 NOTE — Addendum Note (Signed)
Addended by: Hortencia Pilar on: 08/16/2018 11:33 AM   Modules accepted: Orders

## 2018-08-19 ENCOUNTER — Other Ambulatory Visit: Payer: Self-pay | Admitting: Family Medicine

## 2018-08-19 ENCOUNTER — Telehealth: Payer: Self-pay | Admitting: Family Medicine

## 2018-08-19 DIAGNOSIS — M545 Low back pain, unspecified: Secondary | ICD-10-CM

## 2018-08-19 DIAGNOSIS — M6281 Muscle weakness (generalized): Secondary | ICD-10-CM | POA: Diagnosis not present

## 2018-08-19 DIAGNOSIS — M2560 Stiffness of unspecified joint, not elsewhere classified: Secondary | ICD-10-CM | POA: Diagnosis not present

## 2018-08-19 DIAGNOSIS — G8929 Other chronic pain: Secondary | ICD-10-CM

## 2018-08-19 DIAGNOSIS — M25552 Pain in left hip: Secondary | ICD-10-CM

## 2018-08-19 DIAGNOSIS — M799 Soft tissue disorder, unspecified: Secondary | ICD-10-CM | POA: Diagnosis not present

## 2018-08-19 NOTE — Telephone Encounter (Signed)
Ordered

## 2018-08-19 NOTE — Telephone Encounter (Signed)
I advised the patient these have been ordered. She will be called to schedule these by the imaging facility. We will notify of results once available.

## 2018-08-19 NOTE — Telephone Encounter (Signed)
Please advise 

## 2018-08-19 NOTE — Telephone Encounter (Signed)
Patient request order for MRI (Back & Lt leg/Hip). Pt's call back [phone]

## 2018-08-21 ENCOUNTER — Other Ambulatory Visit: Payer: Self-pay

## 2018-08-21 ENCOUNTER — Encounter (HOSPITAL_COMMUNITY): Payer: Self-pay | Admitting: Emergency Medicine

## 2018-08-21 ENCOUNTER — Emergency Department (HOSPITAL_COMMUNITY)
Admission: EM | Admit: 2018-08-21 | Discharge: 2018-08-21 | Disposition: A | Discharge status: Home / Self Care | Payer: BC Managed Care – PPO | Attending: Emergency Medicine | Admitting: Emergency Medicine

## 2018-08-21 ENCOUNTER — Telehealth: Payer: Self-pay | Admitting: Family Medicine

## 2018-08-21 DIAGNOSIS — M5416 Radiculopathy, lumbar region: Secondary | ICD-10-CM | POA: Insufficient documentation

## 2018-08-21 DIAGNOSIS — Z79899 Other long term (current) drug therapy: Secondary | ICD-10-CM | POA: Insufficient documentation

## 2018-08-21 DIAGNOSIS — M799 Soft tissue disorder, unspecified: Secondary | ICD-10-CM | POA: Diagnosis not present

## 2018-08-21 DIAGNOSIS — M545 Low back pain: Secondary | ICD-10-CM | POA: Diagnosis not present

## 2018-08-21 DIAGNOSIS — M2560 Stiffness of unspecified joint, not elsewhere classified: Secondary | ICD-10-CM | POA: Diagnosis not present

## 2018-08-21 DIAGNOSIS — M6281 Muscle weakness (generalized): Secondary | ICD-10-CM | POA: Diagnosis not present

## 2018-08-21 MED ORDER — PREDNISONE 20 MG PO TABS: 40.0000 mg | ORAL_TABLET | Freq: Every day | ORAL | 0 refills | Status: DC

## 2018-08-21 MED ORDER — PREDNISONE 20 MG PO TABS
60.0000 mg | ORAL_TABLET | Freq: Once | ORAL | Status: AC
  Administered 2018-08-21: 60 mg via ORAL
  Filled 2018-08-21: qty 3

## 2018-08-21 MED ORDER — ONDANSETRON 4 MG PO TBDP
4.0000 mg | ORAL_TABLET | Freq: Once | ORAL | Status: AC
  Administered 2018-08-21: 4 mg via ORAL
  Filled 2018-08-21: qty 1

## 2018-08-21 MED ORDER — ONDANSETRON HCL 4 MG PO TABS: 4.0000 mg | ORAL_TABLET | Freq: Three times a day (TID) | ORAL | 0 refills | Status: DC | PRN

## 2018-08-21 MED ORDER — HYDROCODONE-ACETAMINOPHEN 5-325 MG PO TABS: 1.0000 | ORAL_TABLET | Freq: Four times a day (QID) | ORAL | 0 refills | Status: DC | PRN

## 2018-08-21 MED ORDER — HYDROCODONE-ACETAMINOPHEN 5-325 MG PO TABS
2.0000 | ORAL_TABLET | Freq: Once | ORAL | Status: AC
  Administered 2018-08-21: 2 via ORAL
  Filled 2018-08-21: qty 2

## 2018-08-21 NOTE — Telephone Encounter (Signed)
I advised the patient to call [facility] directly to see if they can get her a sooner appointment. If she is willing to go 2 separate times for the 2 separate MRIs, they may be able to move the appointments up.  At the ED last night, they gave her some hydrocodone and odansetron. This worked well for her pain. Can she get an Rx for each of these? Also, should she take this in place of the tramadol, or can she take along with/in between hydrocodone doses?

## 2018-08-21 NOTE — ED Provider Notes (Signed)
[facility] Note   CSN: [id number] Arrival date & time: 08/21/18  0112     History   Chief Complaint Chief Complaint  Patient presents with  . Back Pain    HPI Christina Howell is a 63 y.o. female.     Patient presents to the emergency department with a chief complaint of low back pain.  She reports associated left lower extremity tingling.  She states that she has been being treated for low back pain by Dr. Prince RomeHilts from orthopedics.  She reports she has been in physical therapy and seeing a chiropractor as well.  She states that tonight her pain significantly worsened.  She has tried taking tramadol and baclofen with no relief.  She denies any fevers or chills.  Denies any bowel or bladder incontinence or saddle anesthesia.  The history is provided by the patient. No language interpreter was used.    Past Medical History:  Diagnosis Date  . Eczema    dermatologist--- Dr. Lonni FixNolan   . Heel spur 2009   by x-ray  . Herniated disc    L4-L5 and T11-T12----has seen neurosurgeon Dr. Venetia MaxonStern  . Plantar fasciitis   . Varicose vein     Patient Active Problem List   Diagnosis Date Noted  . Acute pain of right knee 01/29/2018  . Throat congestion 09/07/2010  . Chest tightness 07/22/2010    Past Surgical History:  Procedure Laterality Date  . TOTAL ABDOMINAL HYSTERECTOMY    . VARICOSE VEIN SURGERY    . WISDOM TOOTH EXTRACTION       OB History   No obstetric history on file.      Home Medications    Prior to Admission medications   Medication Sig Start Date End Date Taking? Authorizing Provider  baclofen (LIORESAL) 10 MG tablet Take 0.5-1 tablets (5-10 mg total) by mouth 3 (three) times daily as needed for muscle spasms. 07/19/18   Hilts, Casimiro NeedleMichael, MD  cyclobenzaprine (FLEXERIL) 10 MG tablet Take 1 tablet (10 mg total) by mouth 3 (three) times daily as needed for muscle spasms. 05/13/18   Hilts, Casimiro NeedleMichael, MD  esomeprazole (NEXIUM) 20  MG capsule Take 20 mg by mouth daily at 12 noon.    [provider]  estradiol (ESTRACE) 0.5 MG tablet Take 0.5 mg by mouth daily.      [provider]  fluocinonide (LIDEX) 0.05 % cream Apply topically as directed.      [provider]  levothyroxine (SYNTHROID, LEVOTHROID) 50 MCG tablet TAKE 1 TABLET BY MOUTH ON EMPTY STOMACH IN MORNING EVERY OTHER DAY ALTERNATING WITH 75MCG 11/15/17   [provider]  levothyroxine (SYNTHROID, LEVOTHROID) 75 MCG tablet TAKE 1 TABLET BY MOUTH ON AN EMPTY STOMACH IN MORNING EVERY OTHER DAY ALTERNATING WITH 50MCG 11/15/17   [provider]  nabumetone (RELAFEN) 750 MG tablet Take 1 tablet (750 mg total) by mouth 2 (two) times daily as needed. 07/19/18   Hilts, Casimiro NeedleMichael, MD  naproxen (NAPROSYN) 500 MG tablet Take 1 tablet (500 mg total) by mouth 2 (two) times daily with a meal. 02/08/18   Tarry KosXu, Naiping M, MD  traMADol (ULTRAM) 50 MG tablet Take 1 tablet (50 mg total) by mouth every 6 (six) hours as needed. 08/16/18   Hilts, Casimiro NeedleMichael, MD    Family History Family History  Problem Relation Age of Onset  . Emphysema Paternal Grandmother   . Rheum arthritis Father     Social History Social History  Tobacco Use  . Smoking status: Never Smoker  . Smokeless tobacco: Never Used  Substance Use Topics  . Alcohol use: Yes  . Drug use: Never     Allergies   Diphenhydramine hcl   Review of Systems Review of Systems  All other systems reviewed and are negative.    Physical Exam Updated Vital Signs BP 135/83 (BP Location: Right Arm)   Pulse 65   Temp 97.7 F (36.5 C) (Oral)   Resp 18   Ht 5\' 8"  (1.727 m)   Wt 86.2 kg   SpO2 95%   BMI 28.89 kg/m   Physical Exam Physical Exam  Constitutional: Pt appears well-developed and well-nourished. No distress.  HENT:  Head: Normocephalic and atraumatic.  Mouth/Throat: Oropharynx is clear and moist. No oropharyngeal exudate.  Eyes: Conjunctivae are normal.  Neck:  Normal range of motion. Neck supple.  No meningismus Cardiovascular: Normal rate, regular rhythm and intact distal pulses.   Pulmonary/Chest: Effort normal and breath sounds normal. No respiratory distress. Pt has no wheezes.  Abdominal: Pt exhibits no distension Musculoskeletal:  Lumbar paraspinal muscles tender to palpation, no bony CTLS spine tenderness, deformity, step-off, or crepitus Lymphadenopathy: Pt has no cervical adenopathy.  Neurological: Pt is alert and oriented Speech is clear and goal oriented, follows commands Normal 5/5 strength in lower extremities bilaterally including dorsiflexion and plantar flexion,  Sensation intact Great toe extension intact Moves extremities without ataxia, coordination intact Normal gait Normal balance No Clonus Skin: Skin is warm and dry. No rash noted. Pt is not diaphoretic. No erythema.  Psychiatric: Pt has a normal mood and affect. Behavior is normal.  Nursing note and vitals reviewed.   ED Treatments / Results  Labs (all labs ordered are listed, but only abnormal results are displayed) Labs Reviewed - No data to display  EKG    Radiology No results found.  Procedures Procedures (including critical care time)  Medications Ordered in ED Medications  HYDROcodone-acetaminophen (NORCO/VICODIN) 5-325 MG per tablet 2 tablet (has no administration in time range)  predniSONE (DELTASONE) tablet 60 mg (has no administration in time range)  ondansetron (ZOFRAN-ODT) disintegrating tablet 4 mg (has no administration in time range)     Initial Impression / Assessment and Plan / ED Course  I have reviewed the triage vital signs and the nursing notes.  Pertinent labs & imaging results that were available during my care of the patient were reviewed by me and considered in my medical decision making (see chart for details).        Patient with acute low back pain.  She has been being followed and treated by orthopedics for the same.   She has Ultram and baclofen at home, which she reports little relief when taking these medications.  Reports new tingling and numbness sensation down the left lower extremity consistent with sciatica.  She denies weakness.  Denies any new injuries to her back or trauma.  Patient does not have any bowel or bladder incontinence, she does not have any weakness, I doubt cauda equina syndrome.  Do not feel that emergent MRI is indicated.  Advised patient to chat with her orthopedic doctor about this.  I offered the patient I am pain medication, which she declined.  Will try treating with prednisone taper.  Patient will contact her orthopedic doctor.  Final Clinical Impressions(s) / ED Diagnoses   Final diagnoses:  Lumbar radiculopathy    ED Discharge Orders         Ordered  predniSONE (DELTASONE) 20 MG tablet  Daily     08/21/18 0647           Montine Circle, PA-C 08/21/18 1749    Dina Rich Barbette Hair, MD 08/22/18 [phone]

## 2018-08-21 NOTE — Telephone Encounter (Signed)
Rx sent 

## 2018-08-21 NOTE — Telephone Encounter (Signed)
Pt called in said she was in the emergency room yesterday 08-20-2018 and she said she is in excruciating pain and she's doing therapy and some medication they gave her at the hospital yesterday so she's wondering is there any way to rush the mri?  [phone]

## 2018-08-21 NOTE — Telephone Encounter (Signed)
Advised the patient the hydrocodone and ondansetron were both called in. She should not take the tramadol while taking the tramadol - it's one or the other. Patient verbalized understanding.

## 2018-08-21 NOTE — ED Triage Notes (Signed)
Pt c/o pain in lower back for several months.  Pt st's pain has increased over past 6 hours.  Pt st's pain is radiating up her spine and down left leg.

## 2018-08-21 NOTE — ED Notes (Signed)
Patient verbalizes understanding of discharge instructions. Opportunity for questioning and answers were provided. Armband removed by staff, pt discharged from ED home via POV.  

## 2018-08-23 DIAGNOSIS — M799 Soft tissue disorder, unspecified: Secondary | ICD-10-CM | POA: Diagnosis not present

## 2018-08-23 DIAGNOSIS — M6281 Muscle weakness (generalized): Secondary | ICD-10-CM | POA: Diagnosis not present

## 2018-08-23 DIAGNOSIS — M545 Low back pain: Secondary | ICD-10-CM | POA: Diagnosis not present

## 2018-08-23 DIAGNOSIS — M2560 Stiffness of unspecified joint, not elsewhere classified: Secondary | ICD-10-CM | POA: Diagnosis not present

## 2018-08-26 ENCOUNTER — Other Ambulatory Visit: Payer: Self-pay | Admitting: Family Medicine

## 2018-08-26 ENCOUNTER — Telehealth: Payer: Self-pay | Admitting: Family Medicine

## 2018-08-26 DIAGNOSIS — M25552 Pain in left hip: Secondary | ICD-10-CM

## 2018-08-26 DIAGNOSIS — M6281 Muscle weakness (generalized): Secondary | ICD-10-CM | POA: Diagnosis not present

## 2018-08-26 DIAGNOSIS — M545 Low back pain, unspecified: Secondary | ICD-10-CM

## 2018-08-26 DIAGNOSIS — M799 Soft tissue disorder, unspecified: Secondary | ICD-10-CM | POA: Diagnosis not present

## 2018-08-26 DIAGNOSIS — M2560 Stiffness of unspecified joint, not elsewhere classified: Secondary | ICD-10-CM | POA: Diagnosis not present

## 2018-08-26 DIAGNOSIS — G8929 Other chronic pain: Secondary | ICD-10-CM

## 2018-08-26 NOTE — Telephone Encounter (Signed)
She also wanted to know if a CT would be just as good as an MRI, on either the back or the hip?

## 2018-08-26 NOTE — Telephone Encounter (Signed)
Patient reports the Norco 5/325 mg is not helping at all. She is asking is she can get a stronger dose of this. Her MRIs are not until 09/16/2018. I told her I will send a message to the referral coordinator to see if another location can get her in sooner.   Please advise on the medication.

## 2018-08-26 NOTE — Telephone Encounter (Signed)
Can't give stronger meds, but since MRI is delayed, will refer to Dr. Ernestina Patches for an ESI.

## 2018-08-26 NOTE — Telephone Encounter (Signed)
MRI would be best.  CT won't give the detail we need unless we do a myelogram (dye injection) along with it.  Might as well just do an ESI first and wait for the MRI.

## 2018-08-26 NOTE — Telephone Encounter (Signed)
Patient called stating in so much pain. None of the meds are helping [phone] She is wanting a different pain medication

## 2018-08-26 NOTE — Telephone Encounter (Signed)
I advised the patient of the [facility] referral and she will need to continue on the pain medication she has.   Will you check with another facility to see if the patient can have MRIs of her Lsp/hip sooner than 09/16/2018 (when she is scheduled for at [facility])?

## 2018-08-27 DIAGNOSIS — M6281 Muscle weakness (generalized): Secondary | ICD-10-CM | POA: Diagnosis not present

## 2018-08-27 DIAGNOSIS — M799 Soft tissue disorder, unspecified: Secondary | ICD-10-CM | POA: Diagnosis not present

## 2018-08-27 DIAGNOSIS — M2560 Stiffness of unspecified joint, not elsewhere classified: Secondary | ICD-10-CM | POA: Diagnosis not present

## 2018-08-27 DIAGNOSIS — M545 Low back pain: Secondary | ICD-10-CM | POA: Diagnosis not present

## 2018-08-28 ENCOUNTER — Telehealth: Payer: Self-pay | Admitting: Family Medicine

## 2018-08-28 ENCOUNTER — Telehealth: Payer: Self-pay

## 2018-08-28 MED ORDER — HYDROCODONE-ACETAMINOPHEN 5-325 MG PO TABS: 1.0000 | ORAL_TABLET | Freq: Two times a day (BID) | ORAL | 0 refills | Status: DC | PRN

## 2018-08-28 NOTE — Telephone Encounter (Signed)
Patient called checking to see if Rx for Hydrocodone had been sent to her pharmacy.  Advised patient that Rx was sent to [facility].

## 2018-08-28 NOTE — Telephone Encounter (Signed)
I advised the patient the Hydrocodone has been sent in to her pharmacy.   Gabriel Cirri, is there any news on getting her MRI sooner? Being that her hip MRI has been denied, she will not be getting that one  - only the lumbar MRI. I think she is still scheduled for both at [facility] on 7/202020. Now that she is only needing the one, is it more likely to find a sooner appointment? Please advise the patient on this.

## 2018-08-28 NOTE — Telephone Encounter (Signed)
Please advise 

## 2018-08-28 NOTE — Telephone Encounter (Signed)
See other phone note: Sent order to [facility] so see if they can get pt in sooner than 07/20, pending appt

## 2018-08-28 NOTE — Telephone Encounter (Signed)
Patient called to request refill Hydrocodone  [phone]

## 2018-08-28 NOTE — Telephone Encounter (Signed)
Sent order to [facility] so see if they can get pt in sooner than 07/20, pending appt

## 2018-08-28 NOTE — Telephone Encounter (Signed)
Rx sent 

## 2018-08-29 ENCOUNTER — Telehealth: Payer: Self-pay | Admitting: *Deleted

## 2018-08-29 DIAGNOSIS — M6281 Muscle weakness (generalized): Secondary | ICD-10-CM | POA: Diagnosis not present

## 2018-08-29 DIAGNOSIS — M799 Soft tissue disorder, unspecified: Secondary | ICD-10-CM | POA: Diagnosis not present

## 2018-08-29 DIAGNOSIS — M2560 Stiffness of unspecified joint, not elsewhere classified: Secondary | ICD-10-CM | POA: Diagnosis not present

## 2018-08-29 DIAGNOSIS — B351 Tinea unguium: Secondary | ICD-10-CM | POA: Diagnosis not present

## 2018-08-29 DIAGNOSIS — Z79899 Other long term (current) drug therapy: Secondary | ICD-10-CM | POA: Diagnosis not present

## 2018-08-29 DIAGNOSIS — M5135 Other intervertebral disc degeneration, thoracolumbar region: Secondary | ICD-10-CM | POA: Diagnosis not present

## 2018-08-29 DIAGNOSIS — M545 Low back pain: Secondary | ICD-10-CM | POA: Diagnosis not present

## 2018-08-29 MED ORDER — DIAZEPAM 5 MG PO TABS: ORAL_TABLET | ORAL | 0 refills | Status: DC

## 2018-08-29 NOTE — Telephone Encounter (Signed)
Rx sent 

## 2018-08-29 NOTE — Telephone Encounter (Signed)
I called and advised patient of Rx that was sent in to her pharmacy.

## 2018-08-29 NOTE — Telephone Encounter (Signed)
Pt is scheduled for MRI at [facility] on 08/30/18 at 8pm, [facility] is a closed unit so pt would like something to relax her and get her through the exam.

## 2018-08-30 DIAGNOSIS — M545 Low back pain: Secondary | ICD-10-CM | POA: Diagnosis not present

## 2018-09-02 ENCOUNTER — Telehealth: Payer: Self-pay | Admitting: Family Medicine

## 2018-09-02 DIAGNOSIS — M545 Low back pain: Secondary | ICD-10-CM | POA: Diagnosis not present

## 2018-09-02 DIAGNOSIS — M799 Soft tissue disorder, unspecified: Secondary | ICD-10-CM | POA: Diagnosis not present

## 2018-09-02 DIAGNOSIS — M6281 Muscle weakness (generalized): Secondary | ICD-10-CM | POA: Diagnosis not present

## 2018-09-02 DIAGNOSIS — M2560 Stiffness of unspecified joint, not elsewhere classified: Secondary | ICD-10-CM | POA: Diagnosis not present

## 2018-09-02 MED ORDER — HYDROCODONE-ACETAMINOPHEN 5-325 MG PO TABS: 1.0000 | ORAL_TABLET | Freq: Two times a day (BID) | ORAL | 0 refills | Status: DC | PRN

## 2018-09-02 NOTE — Telephone Encounter (Signed)
Please advise. She had an MRI at [facility] on 08/30/2018 [facility]).

## 2018-09-02 NOTE — Telephone Encounter (Signed)
I called and notified the patient of the lab results. She is scheduled for an ESI with Dr. Ernestina Patches on 09/16/2018, is not interested in trying a chiropractor again, is continuing PT and has an appointment with the neurosurgeon, Dr. Vertell Limber on 10/27/18. She is wondering if the neurosurgery appointment could possible be sooner, since she has had an MRI since that appointment was made. She would like her PCP, Dr. Rex Kras to receive a copy of the MRI as well.

## 2018-09-02 NOTE — Telephone Encounter (Signed)
Pt called in requesting a refill on her medication for hydrocodone.   [phone]

## 2018-09-02 NOTE — Telephone Encounter (Signed)
MRI shows L4-5 disc protrusion contacting the left L5 nerve root.  This is probably the source of her pain.  Options include referral for ESI; a brief trial of chiropractic; or surgical consult.

## 2018-09-03 ENCOUNTER — Telehealth: Payer: Self-pay | Admitting: Family Medicine

## 2018-09-03 MED ORDER — HYDROCODONE-ACETAMINOPHEN 5-325 MG PO TABS: 1.0000 | ORAL_TABLET | Freq: Four times a day (QID) | ORAL | 0 refills | Status: DC | PRN

## 2018-09-03 NOTE — Telephone Encounter (Signed)
I changed the instructions but she still needs to use as sparingly as possible.  If her body gets used to high doses of narcotics before surgery, it will be very hard to control her pain after surgery.

## 2018-09-03 NOTE — Telephone Encounter (Signed)
Patient states that the Rx for Hydrocodone dosage was changed from every 6 hours to 2 times a day and she was not aware of the change. Pt states the pharmacy will not fill her Rx because of the change and she's in severe pain.

## 2018-09-03 NOTE — Telephone Encounter (Signed)
I spoke with our referral coordinator, who is sending a copy of the MRI to Dr. Vertell Limber with the info that she will have an ESI on 09/16/2018 --- hopefully, this will help her get a sooner appointment with him. Copy of MRI emailed to the patient and faxed to Dr. Hulan Fess (by our medical records person) per patient request.

## 2018-09-03 NOTE — Telephone Encounter (Signed)
I called and advised the patient. 

## 2018-09-03 NOTE — Telephone Encounter (Signed)
Maybe we can fax her report to Dr. Vertell Limber?  She can get on his cancellation list too.

## 2018-09-03 NOTE — Telephone Encounter (Signed)
Please advise 

## 2018-09-03 NOTE — Telephone Encounter (Signed)
appt done

## 2018-09-04 ENCOUNTER — Other Ambulatory Visit: Payer: Self-pay | Admitting: Physical Medicine and Rehabilitation

## 2018-09-04 DIAGNOSIS — M799 Soft tissue disorder, unspecified: Secondary | ICD-10-CM | POA: Diagnosis not present

## 2018-09-04 DIAGNOSIS — M545 Low back pain: Secondary | ICD-10-CM | POA: Diagnosis not present

## 2018-09-04 DIAGNOSIS — M2560 Stiffness of unspecified joint, not elsewhere classified: Secondary | ICD-10-CM | POA: Diagnosis not present

## 2018-09-04 DIAGNOSIS — M6281 Muscle weakness (generalized): Secondary | ICD-10-CM | POA: Diagnosis not present

## 2018-09-04 NOTE — Telephone Encounter (Signed)
Looks like she had 5 tablets from Dr. Junius Roads on 08/29/18? If she has any left she can use that for the injection, if not let me know

## 2018-09-04 NOTE — Telephone Encounter (Signed)
Called pt and lvm

## 2018-09-05 ENCOUNTER — Ambulatory Visit (INDEPENDENT_AMBULATORY_CARE_PROVIDER_SITE_OTHER): Payer: BC Managed Care – PPO | Admitting: Family Medicine

## 2018-09-05 ENCOUNTER — Encounter: Payer: Self-pay | Admitting: Family Medicine

## 2018-09-05 ENCOUNTER — Other Ambulatory Visit: Payer: Self-pay

## 2018-09-05 DIAGNOSIS — M25552 Pain in left hip: Secondary | ICD-10-CM

## 2018-09-05 DIAGNOSIS — M545 Low back pain, unspecified: Secondary | ICD-10-CM

## 2018-09-05 DIAGNOSIS — G8929 Other chronic pain: Secondary | ICD-10-CM | POA: Diagnosis not present

## 2018-09-05 MED ORDER — TRAMADOL HCL 50 MG PO TABS: 50.0000 mg | ORAL_TABLET | Freq: Three times a day (TID) | ORAL | 0 refills | Status: DC | PRN

## 2018-09-05 NOTE — Progress Notes (Signed)
   Office Visit Note   Patient: Christina Howell           Date of Birth: Jul 13, 1955           MRN: [id number] Visit Date: 09/05/2018 Requested by: Hulan Fess, MD [place],  [place] [address] PCP: Hulan Fess, MD  Subjective: Chief Complaint  Patient presents with  . Lower Back - Pain  . Left Hip - Pain    HPI: She is here for follow-up low back and left leg pain.  In the past couple days she is actually feeling much better.  She is working with physical therapy and using tramadol which seems to help more than hydrocodone.  She occasionally takes Aleve.  She is scheduled for an epidural injection on July 20 and has a consultation scheduled next month with Dr. Vertell Limber.              ROS: Denies fevers or chills, bowel or bladder dysfunction.  All other systems were reviewed and are negative.  Objective: Vital Signs: There were no vitals taken for this visit.  Physical Exam:  General:  Alert and oriented, in no acute distress. Pulm:  Breathing unlabored. Psy:  Normal mood, congruent affect. Skin: No visible rash. Low back: She has slight tenderness in the left posterior hip/sciatic notch area.  Lower extremity strength and reflexes normal, straight leg raise negative.  Imaging: None today.  Assessment & Plan: 1.  Improving left-sided sciatica with L4-5 protrusion and L5 nerve impingement. -Refilled tramadol, continue with physical therapy.  If her pain steadily improves she will call to cancel her epidural injection but she will keep her appointment with neurosurgeon to get his opinion.     Procedures: No procedures performed  No notes on file     PMFS History: Patient Active Problem List   Diagnosis Date Noted  . Acute pain of right knee 01/29/2018  . Throat congestion 09/07/2010  . Chest tightness 07/22/2010   Past Medical History:  Diagnosis Date  . Eczema    dermatologist--- Dr. Teressa Senter   . Heel spur 2009   by x-ray  . Herniated disc    L4-L5  and T11-T12----has seen neurosurgeon Dr. Vertell Limber  . Plantar fasciitis   . Varicose vein     Family History  Problem Relation Age of Onset  . Emphysema Paternal Grandmother   . Rheum arthritis Father     Past Surgical History:  Procedure Laterality Date  . TOTAL ABDOMINAL HYSTERECTOMY    . VARICOSE VEIN SURGERY    . WISDOM TOOTH EXTRACTION     Social History   Occupational History  . Occupation: Product manager: [employer]  Tobacco Use  . Smoking status: Never Smoker  . Smokeless tobacco: Never Used  Substance and Sexual Activity  . Alcohol use: Yes  . Drug use: Never  . Sexual activity: Not on file

## 2018-09-06 DIAGNOSIS — M545 Low back pain: Secondary | ICD-10-CM | POA: Diagnosis not present

## 2018-09-06 DIAGNOSIS — M2560 Stiffness of unspecified joint, not elsewhere classified: Secondary | ICD-10-CM | POA: Diagnosis not present

## 2018-09-06 DIAGNOSIS — M799 Soft tissue disorder, unspecified: Secondary | ICD-10-CM | POA: Diagnosis not present

## 2018-09-06 DIAGNOSIS — M6281 Muscle weakness (generalized): Secondary | ICD-10-CM | POA: Diagnosis not present

## 2018-09-09 DIAGNOSIS — M545 Low back pain: Secondary | ICD-10-CM | POA: Diagnosis not present

## 2018-09-09 DIAGNOSIS — M6281 Muscle weakness (generalized): Secondary | ICD-10-CM | POA: Diagnosis not present

## 2018-09-09 DIAGNOSIS — M799 Soft tissue disorder, unspecified: Secondary | ICD-10-CM | POA: Diagnosis not present

## 2018-09-09 DIAGNOSIS — M2560 Stiffness of unspecified joint, not elsewhere classified: Secondary | ICD-10-CM | POA: Diagnosis not present

## 2018-09-11 DIAGNOSIS — M2560 Stiffness of unspecified joint, not elsewhere classified: Secondary | ICD-10-CM | POA: Diagnosis not present

## 2018-09-11 DIAGNOSIS — M799 Soft tissue disorder, unspecified: Secondary | ICD-10-CM | POA: Diagnosis not present

## 2018-09-11 DIAGNOSIS — M545 Low back pain: Secondary | ICD-10-CM | POA: Diagnosis not present

## 2018-09-11 DIAGNOSIS — M6281 Muscle weakness (generalized): Secondary | ICD-10-CM | POA: Diagnosis not present

## 2018-09-11 NOTE — Telephone Encounter (Signed)
Called pt lvm #2 

## 2018-09-13 DIAGNOSIS — M2560 Stiffness of unspecified joint, not elsewhere classified: Secondary | ICD-10-CM | POA: Diagnosis not present

## 2018-09-13 DIAGNOSIS — M6281 Muscle weakness (generalized): Secondary | ICD-10-CM | POA: Diagnosis not present

## 2018-09-13 DIAGNOSIS — M799 Soft tissue disorder, unspecified: Secondary | ICD-10-CM | POA: Diagnosis not present

## 2018-09-13 DIAGNOSIS — M545 Low back pain: Secondary | ICD-10-CM | POA: Diagnosis not present

## 2018-09-16 ENCOUNTER — Other Ambulatory Visit: Payer: BC Managed Care – PPO

## 2018-09-16 ENCOUNTER — Encounter: Payer: Self-pay | Admitting: Physical Medicine and Rehabilitation

## 2018-09-16 ENCOUNTER — Ambulatory Visit (INDEPENDENT_AMBULATORY_CARE_PROVIDER_SITE_OTHER): Payer: BC Managed Care – PPO | Admitting: Physical Medicine and Rehabilitation

## 2018-09-16 ENCOUNTER — Ambulatory Visit: Payer: BC Managed Care – PPO

## 2018-09-16 VITALS — BP 143/87 | HR 86

## 2018-09-16 DIAGNOSIS — M5416 Radiculopathy, lumbar region: Secondary | ICD-10-CM

## 2018-09-16 MED ORDER — BETAMETHASONE SOD PHOS & ACET 6 (3-3) MG/ML IJ SUSP
12.0000 mg | Freq: Once | INTRAMUSCULAR | Status: AC
  Administered 2018-09-16: 12 mg

## 2018-09-16 NOTE — Telephone Encounter (Signed)
Pt had appt 09/16/2018.

## 2018-09-16 NOTE — Procedures (Signed)
Lumbosacral Transforaminal Epidural Steroid Injection - Sub-Pedicular Approach with Fluoroscopic Guidance  Patient: Christina Howell      Date of Birth: [birth year] MRN: [id number] PCP: Hulan Fess, MD      Visit Date: 09/16/2018   Universal Protocol:    Date/Time: 09/16/2018  Consent Given By: the patient  Position: PRONE  Additional Comments: Vital signs were monitored before and after the procedure. Patient was prepped and draped in the usual sterile fashion. The correct patient, procedure, and site was verified.   Injection Procedure Details:  Procedure Site One Meds Administered:  Meds ordered this encounter  Medications  . betamethasone acetate-betamethasone sodium phosphate (CELESTONE) injection 12 mg    Laterality: Left  Location/Site:  L5-S1  Needle size: 22 G  Needle type: Spinal  Needle Placement: Transforaminal  Findings:    -Comments: Excellent flow of contrast along the nerve and into the epidural space.  Patient was very nervous during the injection.  She had preprocedure diazepam but this really did not help very much.  Cold spray and needling into the skin were very sensitive and any movement through the muscle elicited almost sort of a panic response.  With breathing technique and talking patient was able to get through the procedure and did well.  Procedure Details: After squaring off the end-plates to get a true AP view, the C-arm was positioned so that an oblique view of the foramen as noted above was visualized. The target area is just inferior to the "nose of the scotty dog" or sub pedicular. The soft tissues overlying this structure were infiltrated with 2-3 ml. of 1% Lidocaine without Epinephrine.  The spinal needle was inserted toward the target using a "trajectory" view along the fluoroscope beam.  Under AP and lateral visualization, the needle was advanced so it did not puncture dura and was located close the 6 O'Clock position of the pedical in AP  tracterory. Biplanar projections were used to confirm position. Aspiration was confirmed to be negative for CSF and/or blood. A 1-2 ml. volume of Isovue-250 was injected and flow of contrast was noted at each level. Radiographs were obtained for documentation purposes.   After attaining the desired flow of contrast documented above, a 0.5 to 1.0 ml test dose of 0.25% Marcaine was injected into each respective transforaminal space.  The patient was observed for 90 seconds post injection.  After no sensory deficits were reported, and normal lower extremity motor function was noted,   the above injectate was administered so that equal amounts of the injectate were placed at each foramen (level) into the transforaminal epidural space.   Additional Comments:  No complications occurred Dressing: 2 x 2 sterile gauze and Band-Aid    Post-procedure details: Patient was observed during the procedure. Post-procedure instructions were reviewed.  Patient left the clinic in stable condition.

## 2018-09-16 NOTE — Progress Notes (Signed)
Christina Howell - 63 y.o. female MRN [id number]  Date of birth: 23-Sep-1955  Office Visit Note: Visit Date: 09/16/2018 PCP: Catha GosselinLittle, Kevin, MD Referred by: Catha GosselinLittle, Kevin, MD  Subjective: Chief Complaint  Patient presents with  . Lower Back - Pain  . Left Thigh - Pain   HPI:  Christina Howell is a 63 y.o. female who comes in today For a left L5 transforaminal epidural steroid injection at the request of Dr. Casimiro NeedleMichael hilts.  Patient's been having severe pain which is limiting her functional ability into the left buttock region and sometimes in the left thigh down to the knee.  Symptoms are posterior lateral.  She said this started in March of this year.  She has had pretty significant pain even having to go to the ER at one point.  MRI of the lumbar spine was performed that shows left paracentral disc herniation impacting the L5 nerve root in the lateral recess.  She has no right-sided complaints.  She does have some back pain.  She is very nervous about the injection and we will go over this today.  She is going to see Dr. Venetia MaxonStern from a neurosurgical evaluation in the near future.  ROS Otherwise per HPI.  Assessment & Plan: Visit Diagnoses:  1. Lumbar radiculopathy     Plan: No additional findings.   Meds & Orders:  Meds ordered this encounter  Medications  . betamethasone acetate-betamethasone sodium phosphate (CELESTONE) injection 12 mg    Orders Placed This Encounter  Procedures  . XR C-ARM NO REPORT  . Epidural Steroid injection    Follow-up: Return if symptoms worsen or fail to improve.   Procedures: No procedures performed  Lumbosacral Transforaminal Epidural Steroid Injection - Sub-Pedicular Approach with Fluoroscopic Guidance  Patient: Christina Howell      Date of Birth: 23-Sep-1955 MRN: [id number] PCP: Catha GosselinLittle, Kevin, MD      Visit Date: 09/16/2018   Universal Protocol:    Date/Time: 09/16/2018  Consent Given By: the patient  Position: PRONE  Additional Comments: Vital signs  were monitored before and after the procedure. Patient was prepped and draped in the usual sterile fashion. The correct patient, procedure, and site was verified.   Injection Procedure Details:  Procedure Site One Meds Administered:  Meds ordered this encounter  Medications  . betamethasone acetate-betamethasone sodium phosphate (CELESTONE) injection 12 mg    Laterality: Left  Location/Site:  L5-S1  Needle size: 22 G  Needle type: Spinal  Needle Placement: Transforaminal  Findings:    -Comments: Excellent flow of contrast along the nerve and into the epidural space.  Patient was very nervous during the injection.  She had preprocedure diazepam but this really did not help very much.  Cold spray and needling into the skin were very sensitive and any movement through the muscle elicited almost sort of a panic response.  With breathing technique and talking patient was able to get through the procedure and did well.  Procedure Details: After squaring off the end-plates to get a true AP view, the C-arm was positioned so that an oblique view of the foramen as noted above was visualized. The target area is just inferior to the "nose of the scotty dog" or sub pedicular. The soft tissues overlying this structure were infiltrated with 2-3 ml. of 1% Lidocaine without Epinephrine.  The spinal needle was inserted toward the target using a "trajectory" view along the fluoroscope beam.  Under AP and lateral visualization, the needle was advanced  so it did not puncture dura and was located close the 6 O'Clock position of the pedical in AP tracterory. Biplanar projections were used to confirm position. Aspiration was confirmed to be negative for CSF and/or blood. A 1-2 ml. volume of Isovue-250 was injected and flow of contrast was noted at each level. Radiographs were obtained for documentation purposes.   After attaining the desired flow of contrast documented above, a 0.5 to 1.0 ml test dose of  0.25% Marcaine was injected into each respective transforaminal space.  The patient was observed for 90 seconds post injection.  After no sensory deficits were reported, and normal lower extremity motor function was noted,   the above injectate was administered so that equal amounts of the injectate were placed at each foramen (level) into the transforaminal epidural space.   Additional Comments:  No complications occurred Dressing: 2 x 2 sterile gauze and Band-Aid    Post-procedure details: Patient was observed during the procedure. Post-procedure instructions were reviewed.  Patient left the clinic in stable condition.    Clinical History: MRI LUMBAR SPINE WITHOUT CONTRAST  TECHNIQUE: Multiplanar, multisequence MR imaging of the lumbar spine was performed. No intravenous contrast was administered.  COMPARISON: None.  FINDINGS: Segmentation: Standard.  Alignment: Physiologic.  Vertebrae: No fracture, evidence of discitis, or bone lesion.  Conus medullaris and cauda equina: Conus extends to the L1 level. Conus and cauda equina appear normal.  Paraspinal and other soft tissues: Negative.  Disc levels:  L1-2: No significant disc displacement, foraminal stenosis, or canal stenosis.  L2-3: No significant disc displacement, foraminal stenosis, or canal stenosis.  L3-4: Mild disc bulge, tiny right central disc protrusion and annular fissure. No foraminal or spinal canal stenosis.  L4-5: Disc desiccation, loss of intervertebral disc space height, mild disc bulge, and 6 mm left subarticular disc protrusion. Left subarticular disc protrusion effaces the left lateral recess and impinges the descending left L5 nerve root (series 7, image 27). No significant foraminal or spinal canal stenosis.  L5-S1: Mild disc bulge, central annular fissure. No significant foraminal or spinal canal stenosis.  IMPRESSION: 1. No acute osseous abnormality or malalignment. 2. L4-5 left  subarticular disc protrusion effaces the left lateral recess and impinges the descending left L5 nerve root. 3. Lumbar spondylosis with predominant discogenic degenerative changes at L3 through S1. L3-4 and L5-S1 annular fissures. No significant foraminal or spinal canal stenosis.   Electronically Signed By: Kristine Garbe M.D. On: 08/31/2018 00:43     Objective:  VS:  HT:    WT:   BMI:     BP:(!) 143/87  HR:86bpm  TEMP: ( )  RESP:  Physical Exam  Ortho Exam Imaging: Xr C-arm No Report  Result Date: 09/16/2018 Please see Notes tab for imaging impression.

## 2018-09-16 NOTE — Progress Notes (Signed)
.  Numeric Pain Rating Scale and Functional Assessment Average Pain 7   In the last MONTH (on 0-10 scale) has pain interfered with the following?  1. General activity like being  able to carry out your everyday physical activities such as walking, climbing stairs, carrying groceries, or moving a chair?  Rating(8)   +Driver, -BT, -Dye Allergies.  

## 2018-09-17 DIAGNOSIS — M545 Low back pain: Secondary | ICD-10-CM | POA: Diagnosis not present

## 2018-09-17 DIAGNOSIS — M6281 Muscle weakness (generalized): Secondary | ICD-10-CM | POA: Diagnosis not present

## 2018-09-17 DIAGNOSIS — M799 Soft tissue disorder, unspecified: Secondary | ICD-10-CM | POA: Diagnosis not present

## 2018-09-17 DIAGNOSIS — M2560 Stiffness of unspecified joint, not elsewhere classified: Secondary | ICD-10-CM | POA: Diagnosis not present

## 2018-09-20 DIAGNOSIS — M6281 Muscle weakness (generalized): Secondary | ICD-10-CM | POA: Diagnosis not present

## 2018-09-20 DIAGNOSIS — M2560 Stiffness of unspecified joint, not elsewhere classified: Secondary | ICD-10-CM | POA: Diagnosis not present

## 2018-09-20 DIAGNOSIS — M799 Soft tissue disorder, unspecified: Secondary | ICD-10-CM | POA: Diagnosis not present

## 2018-09-20 DIAGNOSIS — M545 Low back pain: Secondary | ICD-10-CM | POA: Diagnosis not present

## 2018-09-24 DIAGNOSIS — M2560 Stiffness of unspecified joint, not elsewhere classified: Secondary | ICD-10-CM | POA: Diagnosis not present

## 2018-09-24 DIAGNOSIS — M545 Low back pain: Secondary | ICD-10-CM | POA: Diagnosis not present

## 2018-09-24 DIAGNOSIS — M799 Soft tissue disorder, unspecified: Secondary | ICD-10-CM | POA: Diagnosis not present

## 2018-09-24 DIAGNOSIS — M6281 Muscle weakness (generalized): Secondary | ICD-10-CM | POA: Diagnosis not present

## 2018-09-25 DIAGNOSIS — H25013 Cortical age-related cataract, bilateral: Secondary | ICD-10-CM | POA: Diagnosis not present

## 2018-09-25 DIAGNOSIS — L719 Rosacea, unspecified: Secondary | ICD-10-CM | POA: Diagnosis not present

## 2018-09-27 DIAGNOSIS — M799 Soft tissue disorder, unspecified: Secondary | ICD-10-CM | POA: Diagnosis not present

## 2018-09-27 DIAGNOSIS — M2560 Stiffness of unspecified joint, not elsewhere classified: Secondary | ICD-10-CM | POA: Diagnosis not present

## 2018-09-27 DIAGNOSIS — M545 Low back pain: Secondary | ICD-10-CM | POA: Diagnosis not present

## 2018-09-27 DIAGNOSIS — M6281 Muscle weakness (generalized): Secondary | ICD-10-CM | POA: Diagnosis not present

## 2018-10-01 DIAGNOSIS — M6281 Muscle weakness (generalized): Secondary | ICD-10-CM | POA: Diagnosis not present

## 2018-10-01 DIAGNOSIS — M545 Low back pain: Secondary | ICD-10-CM | POA: Diagnosis not present

## 2018-10-01 DIAGNOSIS — M799 Soft tissue disorder, unspecified: Secondary | ICD-10-CM | POA: Diagnosis not present

## 2018-10-01 DIAGNOSIS — M2560 Stiffness of unspecified joint, not elsewhere classified: Secondary | ICD-10-CM | POA: Diagnosis not present

## 2018-10-04 DIAGNOSIS — M799 Soft tissue disorder, unspecified: Secondary | ICD-10-CM | POA: Diagnosis not present

## 2018-10-04 DIAGNOSIS — M2560 Stiffness of unspecified joint, not elsewhere classified: Secondary | ICD-10-CM | POA: Diagnosis not present

## 2018-10-04 DIAGNOSIS — M6281 Muscle weakness (generalized): Secondary | ICD-10-CM | POA: Diagnosis not present

## 2018-10-04 DIAGNOSIS — M545 Low back pain: Secondary | ICD-10-CM | POA: Diagnosis not present

## 2018-10-07 DIAGNOSIS — M2560 Stiffness of unspecified joint, not elsewhere classified: Secondary | ICD-10-CM | POA: Diagnosis not present

## 2018-10-07 DIAGNOSIS — M6281 Muscle weakness (generalized): Secondary | ICD-10-CM | POA: Diagnosis not present

## 2018-10-07 DIAGNOSIS — M545 Low back pain: Secondary | ICD-10-CM | POA: Diagnosis not present

## 2018-10-07 DIAGNOSIS — M799 Soft tissue disorder, unspecified: Secondary | ICD-10-CM | POA: Diagnosis not present

## 2018-10-22 DIAGNOSIS — Z23 Encounter for immunization: Secondary | ICD-10-CM | POA: Diagnosis not present

## 2018-10-23 DIAGNOSIS — Z0000 Encounter for general adult medical examination without abnormal findings: Secondary | ICD-10-CM | POA: Diagnosis not present

## 2018-10-23 DIAGNOSIS — H6121 Impacted cerumen, right ear: Secondary | ICD-10-CM | POA: Diagnosis not present

## 2018-10-24 DIAGNOSIS — Z6830 Body mass index (BMI) 30.0-30.9, adult: Secondary | ICD-10-CM | POA: Diagnosis not present

## 2018-10-24 DIAGNOSIS — E039 Hypothyroidism, unspecified: Secondary | ICD-10-CM | POA: Diagnosis not present

## 2018-10-24 DIAGNOSIS — Z13220 Encounter for screening for lipoid disorders: Secondary | ICD-10-CM | POA: Diagnosis not present

## 2018-10-24 DIAGNOSIS — Z0000 Encounter for general adult medical examination without abnormal findings: Secondary | ICD-10-CM | POA: Diagnosis not present

## 2018-10-25 ENCOUNTER — Other Ambulatory Visit: Payer: Self-pay | Admitting: Family Medicine

## 2018-10-25 DIAGNOSIS — Z1231 Encounter for screening mammogram for malignant neoplasm of breast: Secondary | ICD-10-CM

## 2018-11-05 DIAGNOSIS — Z23 Encounter for immunization: Secondary | ICD-10-CM | POA: Diagnosis not present

## 2018-11-06 DIAGNOSIS — L820 Inflamed seborrheic keratosis: Secondary | ICD-10-CM | POA: Diagnosis not present

## 2018-11-06 DIAGNOSIS — D225 Melanocytic nevi of trunk: Secondary | ICD-10-CM | POA: Diagnosis not present

## 2018-11-06 DIAGNOSIS — L573 Poikiloderma of Civatte: Secondary | ICD-10-CM | POA: Diagnosis not present

## 2018-11-06 DIAGNOSIS — L718 Other rosacea: Secondary | ICD-10-CM | POA: Diagnosis not present

## 2018-11-06 DIAGNOSIS — B078 Other viral warts: Secondary | ICD-10-CM | POA: Diagnosis not present

## 2018-11-11 DIAGNOSIS — M47816 Spondylosis without myelopathy or radiculopathy, lumbar region: Secondary | ICD-10-CM | POA: Diagnosis not present

## 2018-11-11 DIAGNOSIS — M545 Low back pain: Secondary | ICD-10-CM | POA: Diagnosis not present

## 2018-11-11 DIAGNOSIS — M5126 Other intervertebral disc displacement, lumbar region: Secondary | ICD-10-CM | POA: Diagnosis not present

## 2018-11-11 DIAGNOSIS — M5416 Radiculopathy, lumbar region: Secondary | ICD-10-CM | POA: Diagnosis not present

## 2018-12-11 ENCOUNTER — Other Ambulatory Visit: Payer: Self-pay

## 2018-12-11 ENCOUNTER — Ambulatory Visit
Admission: RE | Admit: 2018-12-11 | Discharge: 2018-12-11 | Disposition: A | Discharge status: Home / Self Care | Payer: BC Managed Care – PPO | Source: Ambulatory Visit | Attending: Family Medicine | Admitting: Family Medicine

## 2018-12-11 DIAGNOSIS — Z1231 Encounter for screening mammogram for malignant neoplasm of breast: Secondary | ICD-10-CM | POA: Diagnosis not present

## 2018-12-17 DIAGNOSIS — B078 Other viral warts: Secondary | ICD-10-CM | POA: Diagnosis not present

## 2018-12-17 DIAGNOSIS — L820 Inflamed seborrheic keratosis: Secondary | ICD-10-CM | POA: Diagnosis not present

## 2019-01-06 DIAGNOSIS — K219 Gastro-esophageal reflux disease without esophagitis: Secondary | ICD-10-CM | POA: Diagnosis not present

## 2019-01-14 DIAGNOSIS — I83813 Varicose veins of bilateral lower extremities with pain: Secondary | ICD-10-CM | POA: Diagnosis not present

## 2019-01-14 DIAGNOSIS — I8312 Varicose veins of left lower extremity with inflammation: Secondary | ICD-10-CM | POA: Diagnosis not present

## 2019-01-14 DIAGNOSIS — I83893 Varicose veins of bilateral lower extremities with other complications: Secondary | ICD-10-CM | POA: Diagnosis not present

## 2019-01-14 DIAGNOSIS — I8311 Varicose veins of right lower extremity with inflammation: Secondary | ICD-10-CM | POA: Diagnosis not present

## 2019-01-17 DIAGNOSIS — Z23 Encounter for immunization: Secondary | ICD-10-CM | POA: Diagnosis not present

## 2019-01-20 DIAGNOSIS — D485 Neoplasm of uncertain behavior of skin: Secondary | ICD-10-CM | POA: Diagnosis not present

## 2019-01-20 DIAGNOSIS — L820 Inflamed seborrheic keratosis: Secondary | ICD-10-CM | POA: Diagnosis not present

## 2019-01-20 DIAGNOSIS — B079 Viral wart, unspecified: Secondary | ICD-10-CM | POA: Diagnosis not present

## 2019-02-03 DIAGNOSIS — K219 Gastro-esophageal reflux disease without esophagitis: Secondary | ICD-10-CM | POA: Diagnosis not present

## 2019-02-05 DIAGNOSIS — M7662 Achilles tendinitis, left leg: Secondary | ICD-10-CM | POA: Diagnosis not present

## 2019-02-05 DIAGNOSIS — M2011 Hallux valgus (acquired), right foot: Secondary | ICD-10-CM | POA: Diagnosis not present

## 2019-02-05 DIAGNOSIS — M6702 Short Achilles tendon (acquired), left ankle: Secondary | ICD-10-CM | POA: Diagnosis not present

## 2019-02-05 DIAGNOSIS — M2012 Hallux valgus (acquired), left foot: Secondary | ICD-10-CM | POA: Diagnosis not present

## 2019-02-12 DIAGNOSIS — M2011 Hallux valgus (acquired), right foot: Secondary | ICD-10-CM | POA: Diagnosis not present

## 2019-02-12 DIAGNOSIS — M7662 Achilles tendinitis, left leg: Secondary | ICD-10-CM | POA: Diagnosis not present

## 2019-02-12 DIAGNOSIS — M6702 Short Achilles tendon (acquired), left ankle: Secondary | ICD-10-CM | POA: Diagnosis not present

## 2019-02-12 DIAGNOSIS — M2012 Hallux valgus (acquired), left foot: Secondary | ICD-10-CM | POA: Diagnosis not present

## 2019-02-26 DIAGNOSIS — A630 Anogenital (venereal) warts: Secondary | ICD-10-CM | POA: Diagnosis not present

## 2019-02-26 DIAGNOSIS — N898 Other specified noninflammatory disorders of vagina: Secondary | ICD-10-CM | POA: Diagnosis not present

## 2019-03-11 DIAGNOSIS — A630 Anogenital (venereal) warts: Secondary | ICD-10-CM | POA: Diagnosis not present

## 2019-04-07 DIAGNOSIS — D1722 Benign lipomatous neoplasm of skin and subcutaneous tissue of left arm: Secondary | ICD-10-CM | POA: Diagnosis not present

## 2019-04-07 DIAGNOSIS — A630 Anogenital (venereal) warts: Secondary | ICD-10-CM | POA: Diagnosis not present

## 2019-04-07 DIAGNOSIS — D1721 Benign lipomatous neoplasm of skin and subcutaneous tissue of right arm: Secondary | ICD-10-CM | POA: Diagnosis not present

## 2019-05-28 DIAGNOSIS — L820 Inflamed seborrheic keratosis: Secondary | ICD-10-CM | POA: Diagnosis not present

## 2019-07-27 IMAGING — MR MR KNEE*R* W/O CM
4 of 7 series · 19 of 40 positions shown · non-contrast
Comparison: Radiographs 01/29/2018.

CLINICAL DATA: Anterior and medial right knee pain with swelling
after injury hiking 1 month ago. No previous relevant surgery.

EXAM:
MRI OF THE RIGHT KNEE WITHOUT CONTRAST
TECHNIQUE: Multiplanar, multisequence MR imaging of the knee was performed. No
intravenous contrast was administered.

[Series 3: T2 fat-sat · axial · 4.0mm · 0.31mm/px · z∈[-36,+65]mm · 3 of 29 slices shown]
[im 6/29]
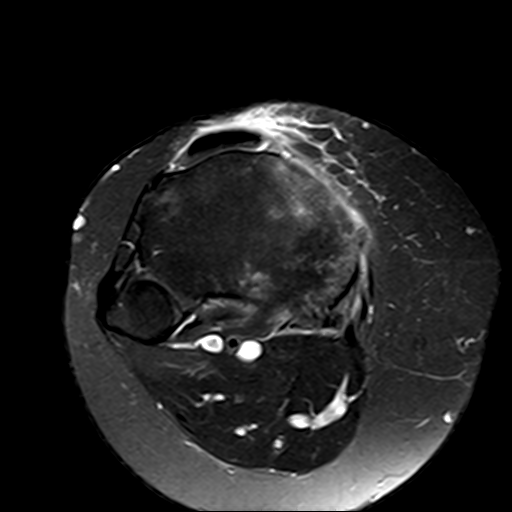
[im 17/29]
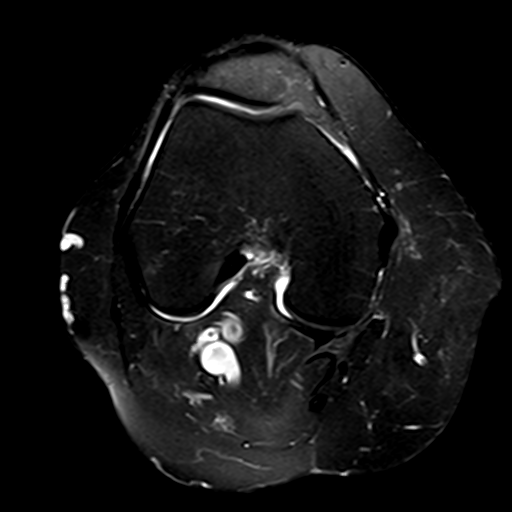
[im 29/29]
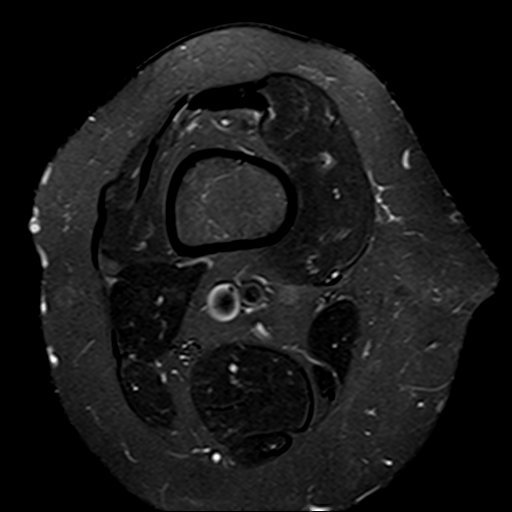

[Series 6: PD fat-sat · coronal · 3.0mm · 0.31mm/px · 7 of 35 slices shown (1 of 3)]
[im 1/35]
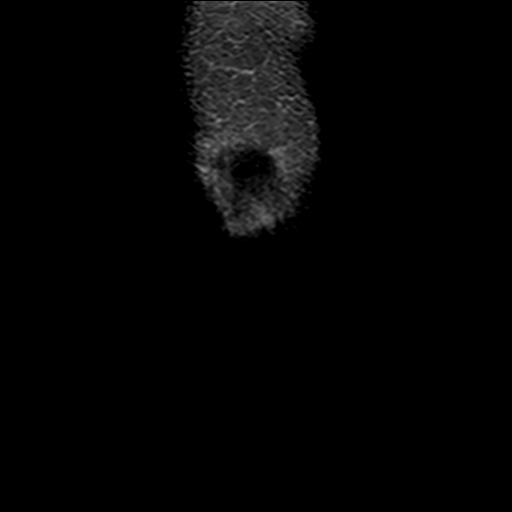
[im 6/35]
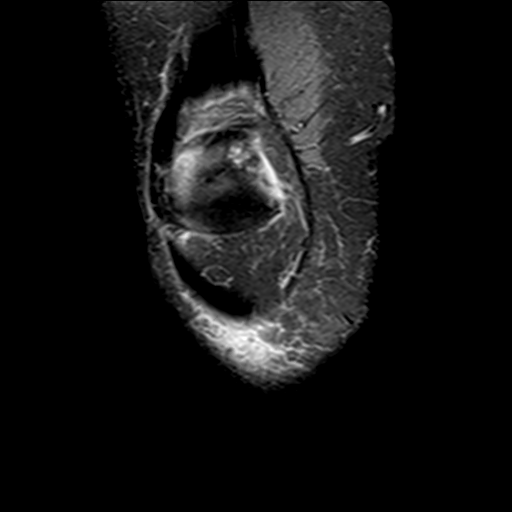
[im 12/35]
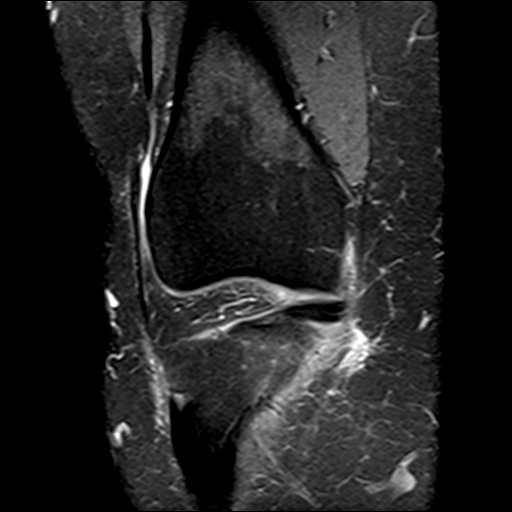
[im 18/35]
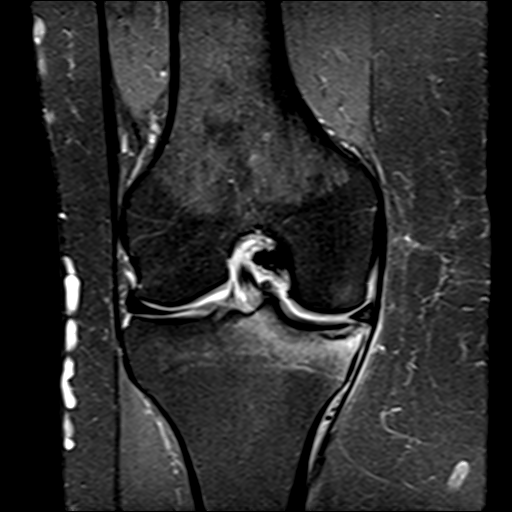
[im 23/35]
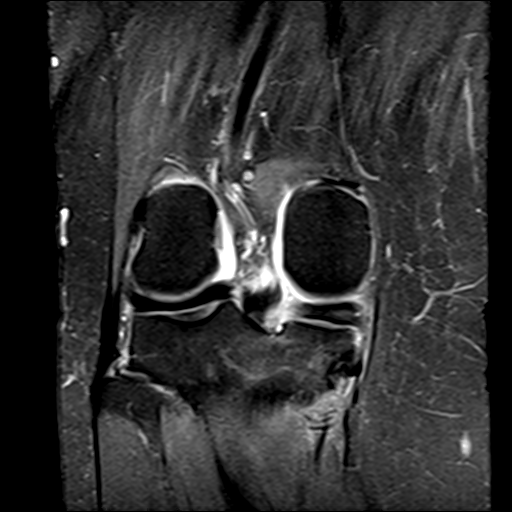
[im 29/35]
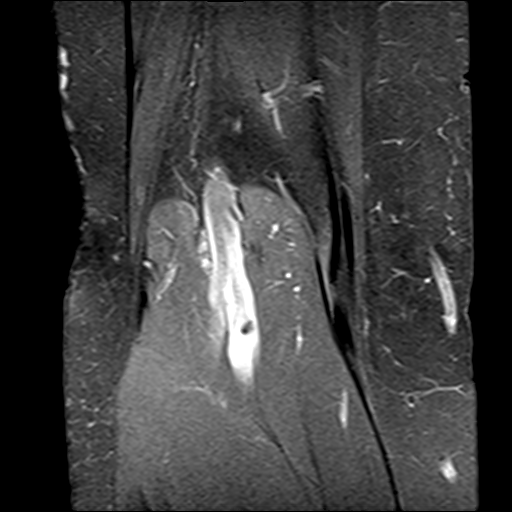
[im 35/35]
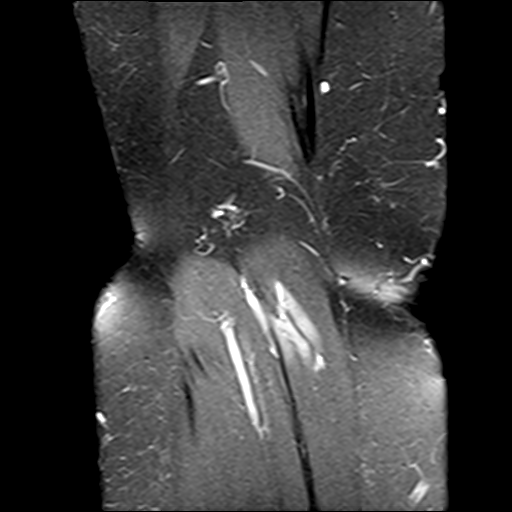

[Series 7: PD fat-sat · sagittal · 3.0mm · 0.31mm/px · 6 of 27 slices shown (2 of 3)]
[im 1/27]
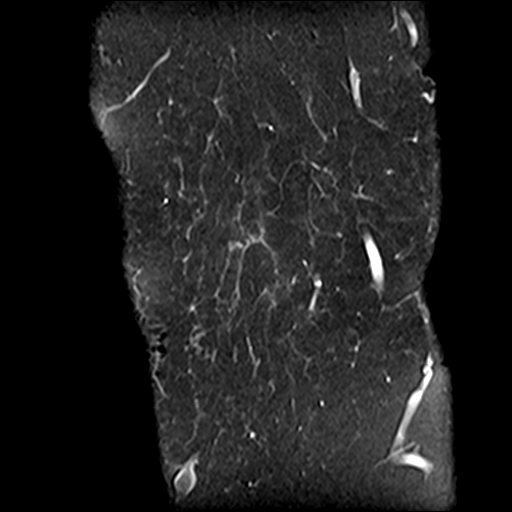
[im 6/27]
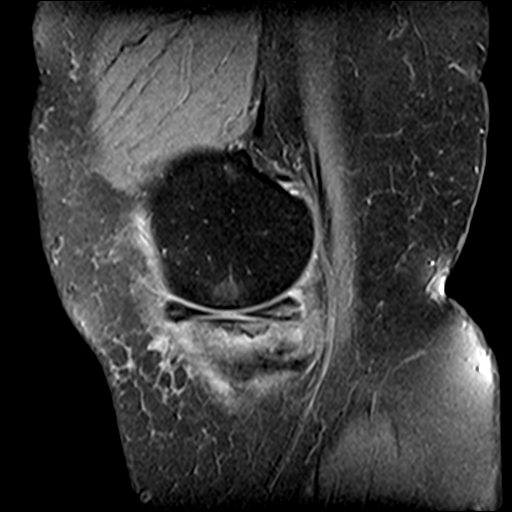
[im 11/27]
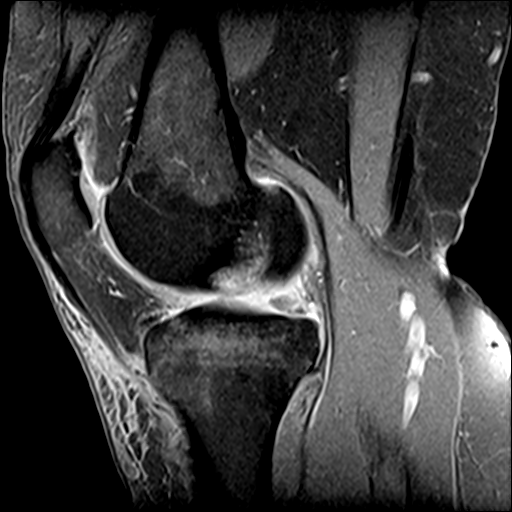
[im 16/27]
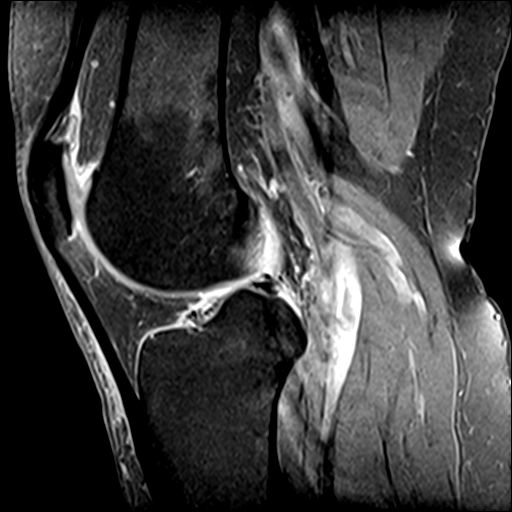
[im 21/27]
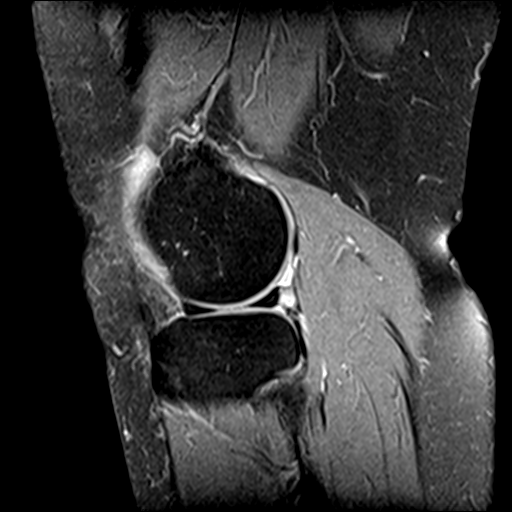
[im 27/27]
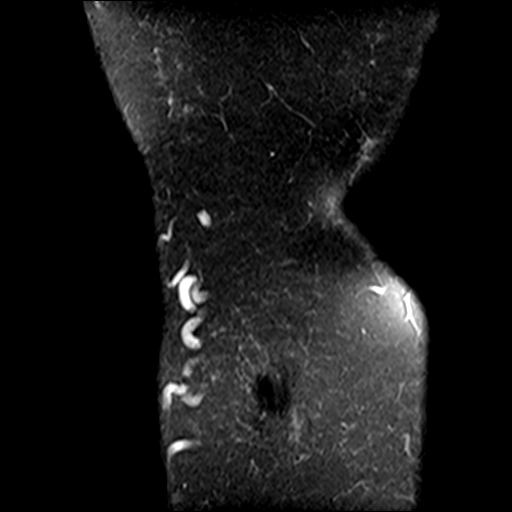

[Series 9: PD fat-sat · coronal · 2.0mm · 0.31mm/px · 3 of 16 slices shown (3 of 3)]
[im 1/16]
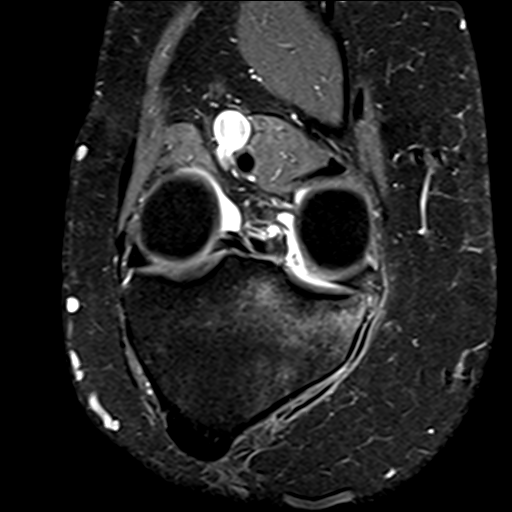
[im 8/16]
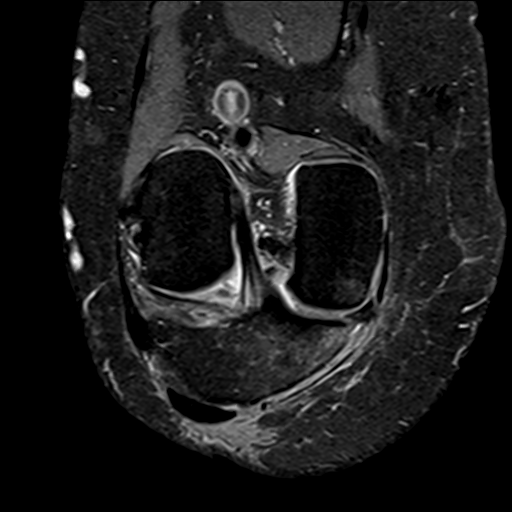
[im 16/16]
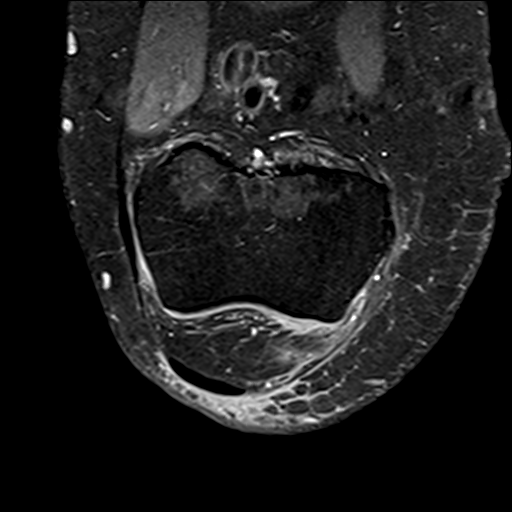

[19 of 40 positions shown; findings below may reference images not displayed]

FINDINGS: MENISCI

Medial meniscus: There is a root tear of the posterior horn with
resulting mild meniscal extrusion peripherally from the joint. There
is degenerative signal within the posterior horn and body. No
centrally displaced meniscal fragment.

Lateral meniscus:  Intact with normal morphology.

LIGAMENTS

Cruciates:  Intact.

Collaterals:  Intact.

CARTILAGE

Patellofemoral: Mild patellofemoral degenerative changes with small
osteophytes and mild subchondral cyst formation superiorly at the
patellar apex and medial facet.

Medial:  The minimal chondral thinning without focal defect.

Lateral:  Preserved.

MISCELLANEOUS

Joint:  No significant joint effusion.

Popliteal Fossa:  Unremarkable. No significant Baker's cyst.

Extensor Mechanism:  Intact.

Bones: There is a linear subchondral insufficiency fracture
involving the medial tibial plateau, best seen on the coronal
images. There is surrounding bone marrow and soft tissue edema. No
significant subchondral collapse. Minimal reactive edema in the
medial femoral condyle.

Other: No other significant periarticular soft tissue findings.
IMPRESSION: 1. Root tear of the posterior horn of the medial meniscus with mild
resulting meniscal extrusion from the joint.
2. Superimposed subchondral insufficiency fracture of the medial
tibial plateau with associated marrow and surrounding soft tissue
edema.
3. Mild patellofemoral and minimal medial compartment degenerative
changes.
4. Intact lateral meniscus, cruciate and collateral ligaments.

## 2019-08-12 DIAGNOSIS — E039 Hypothyroidism, unspecified: Secondary | ICD-10-CM | POA: Diagnosis not present

## 2019-08-12 DIAGNOSIS — R42 Dizziness and giddiness: Secondary | ICD-10-CM | POA: Diagnosis not present

## 2019-10-23 DIAGNOSIS — E7800 Pure hypercholesterolemia, unspecified: Secondary | ICD-10-CM | POA: Diagnosis not present

## 2019-10-23 DIAGNOSIS — E039 Hypothyroidism, unspecified: Secondary | ICD-10-CM | POA: Diagnosis not present

## 2019-10-23 DIAGNOSIS — Z0000 Encounter for general adult medical examination without abnormal findings: Secondary | ICD-10-CM | POA: Diagnosis not present

## 2019-11-12 ENCOUNTER — Other Ambulatory Visit: Payer: Self-pay | Admitting: Family Medicine

## 2019-11-12 DIAGNOSIS — Z1231 Encounter for screening mammogram for malignant neoplasm of breast: Secondary | ICD-10-CM

## 2019-12-01 DIAGNOSIS — Z23 Encounter for immunization: Secondary | ICD-10-CM | POA: Diagnosis not present

## 2019-12-02 DIAGNOSIS — Z1159 Encounter for screening for other viral diseases: Secondary | ICD-10-CM | POA: Diagnosis not present

## 2019-12-08 DIAGNOSIS — Z20822 Contact with and (suspected) exposure to covid-19: Secondary | ICD-10-CM | POA: Diagnosis not present

## 2019-12-26 DIAGNOSIS — D1801 Hemangioma of skin and subcutaneous tissue: Secondary | ICD-10-CM | POA: Diagnosis not present

## 2019-12-26 DIAGNOSIS — D229 Melanocytic nevi, unspecified: Secondary | ICD-10-CM | POA: Diagnosis not present

## 2019-12-26 DIAGNOSIS — L820 Inflamed seborrheic keratosis: Secondary | ICD-10-CM | POA: Diagnosis not present

## 2019-12-26 DIAGNOSIS — L814 Other melanin hyperpigmentation: Secondary | ICD-10-CM | POA: Diagnosis not present

## 2019-12-26 DIAGNOSIS — L570 Actinic keratosis: Secondary | ICD-10-CM | POA: Diagnosis not present

## 2019-12-26 DIAGNOSIS — L819 Disorder of pigmentation, unspecified: Secondary | ICD-10-CM | POA: Diagnosis not present

## 2019-12-29 ENCOUNTER — Other Ambulatory Visit: Payer: Self-pay

## 2019-12-29 ENCOUNTER — Ambulatory Visit
Admission: RE | Admit: 2019-12-29 | Discharge: 2019-12-29 | Disposition: A | Discharge status: Home / Self Care | Payer: BC Managed Care – PPO | Source: Ambulatory Visit | Attending: Family Medicine | Admitting: Family Medicine

## 2019-12-29 DIAGNOSIS — Z1231 Encounter for screening mammogram for malignant neoplasm of breast: Secondary | ICD-10-CM

## 2019-12-29 DIAGNOSIS — H25013 Cortical age-related cataract, bilateral: Secondary | ICD-10-CM | POA: Diagnosis not present

## 2019-12-29 DIAGNOSIS — L719 Rosacea, unspecified: Secondary | ICD-10-CM | POA: Diagnosis not present

## 2019-12-31 DIAGNOSIS — H903 Sensorineural hearing loss, bilateral: Secondary | ICD-10-CM | POA: Diagnosis not present

## 2020-01-16 ENCOUNTER — Ambulatory Visit (INDEPENDENT_AMBULATORY_CARE_PROVIDER_SITE_OTHER): Payer: No Typology Code available for payment source | Admitting: Orthopaedic Surgery

## 2020-01-16 ENCOUNTER — Encounter: Payer: Self-pay | Admitting: Orthopaedic Surgery

## 2020-01-16 ENCOUNTER — Ambulatory Visit: Payer: Self-pay

## 2020-01-16 ENCOUNTER — Other Ambulatory Visit: Payer: Self-pay

## 2020-01-16 DIAGNOSIS — M25511 Pain in right shoulder: Secondary | ICD-10-CM | POA: Diagnosis not present

## 2020-01-16 DIAGNOSIS — M542 Cervicalgia: Secondary | ICD-10-CM | POA: Diagnosis not present

## 2020-01-16 DIAGNOSIS — G8929 Other chronic pain: Secondary | ICD-10-CM

## 2020-01-16 MED ORDER — TRAMADOL HCL 50 MG PO TABS: 50.0000 mg | ORAL_TABLET | Freq: Three times a day (TID) | ORAL | 0 refills | Status: DC | PRN

## 2020-01-16 MED ORDER — PREDNISONE 10 MG (21) PO TBPK: ORAL_TABLET | ORAL | 0 refills | Status: DC

## 2020-01-16 MED ORDER — METHOCARBAMOL 500 MG PO TABS: 500.0000 mg | ORAL_TABLET | Freq: Two times a day (BID) | ORAL | 0 refills | Status: DC | PRN

## 2020-01-16 NOTE — Progress Notes (Signed)
Office Visit Note   Patient: Christina Howell           Date of Birth: [birth year]           MRN: [id number] Visit Date: 01/16/2020              Requested by: Catha Gosselin, MD [address] [place],  [place] [address] PCP: Catha Gosselin, MD   Assessment & Plan: Visit Diagnoses:  1. Chronic right shoulder pain   2. Neck pain     Plan: Impression is right shoulder pain and injury x2 with concerns for rotator cuff pathology.  At this point, we will order an MRI of the right shoulder to assess her rotator cuff.  She will follow up with [place] once has been completed.  Follow-Up Instructions: Return for after MRI right shoulder.   Orders:  Orders Placed This Encounter  Procedures  . XR Shoulder Right  . XR Cervical Spine 2 or 3 views  . MR SHOULDER RIGHT WO CONTRAST   Meds ordered this encounter  Medications  . predniSONE (STERAPRED UNI-PAK 21 TAB) 10 MG (21) TBPK tablet    Sig: Take as directed    Dispense:  21 tablet    Refill:  0  . traMADol (ULTRAM) 50 MG tablet    Sig: Take 1 tablet (50 mg total) by mouth 3 (three) times daily as needed.    Dispense:  30 tablet    Refill:  0  . methocarbamol (ROBAXIN) 500 MG tablet    Sig: Take 1 tablet (500 mg total) by mouth 2 (two) times daily as needed.    Dispense:  20 tablet    Refill:  0      Procedures: No procedures performed   Clinical Data: No additional findings.   Subjective: Chief Complaint  Patient presents with  . Right Shoulder - Pain    HPI patient is a pleasant 64 year old female who comes in today with right shoulder pain.  This initially began back in August of this year when she fell backwards going down a set of stairs landing somewhat on her right shoulder.  She started to notice some improvement but then fell face first on 01/02/2020.  She has had increased pain to the right shoulder which radiates into the deltoid, elbow and hand since.  She has associated weakness to the right upper extremity.  Her pain  is increased at night and as well as with any movement of the shoulder.  She has tried over-the-counter NSAIDs without relief of symptoms.  She denies any numbness, tingling or burning.  Review of Systems as detailed in HPI.  All others reviewed and are negative.   Objective: Vital Signs: There were no vitals taken for this visit.  Physical Exam well-developed well-nourished female no acute distress.  Alert and oriented x3.  Ortho Exam right shoulder exam reveals forward flexion to about 160 degrees, all other planes are very limited secondary to pain and weakness.  She does have a markedly positive empty can with very little strength.  Pain and weakness with resisted internal and external rotation as well as abduction and abduction.  Negative belly press.  She is neurovascularly intact distally.  Specialty Comments:  No specialty comments available.  Imaging: XR Cervical Spine 2 or 3 views  Result Date: 01/16/2020 Diffuse multilevel degenerative changes  XR Shoulder Right  Result Date: 01/16/2020 No acute or structural abnormalities    PMFS History: Patient Active Problem List   Diagnosis  Date Noted  . Acute pain of right knee 01/29/2018  . Throat congestion 09/07/2010  . Chest tightness 07/22/2010   Past Medical History:  Diagnosis Date  . Eczema    dermatologist--- Dr. Lonni Fix   . Heel spur 2009   by x-ray  . Herniated disc    L4-L5 and T11-T12----has seen neurosurgeon Dr. Venetia Maxon  . Plantar fasciitis   . Varicose vein     Family History  Problem Relation Age of Onset  . Emphysema Paternal Grandmother   . Rheum arthritis Father     Past Surgical History:  Procedure Laterality Date  . TOTAL ABDOMINAL HYSTERECTOMY    . VARICOSE VEIN SURGERY    . WISDOM TOOTH EXTRACTION     Social History   Occupational History  . Occupation: Magazine features editor: [employer]  Tobacco Use  . Smoking status: Never Smoker  . Smokeless tobacco: Never Used  Substance  and Sexual Activity  . Alcohol use: Yes  . Drug use: Never  . Sexual activity: Not on file

## 2020-01-21 ENCOUNTER — Telehealth: Payer: Self-pay | Admitting: Orthopaedic Surgery

## 2020-01-21 NOTE — Telephone Encounter (Signed)
Please advise 

## 2020-01-21 NOTE — Telephone Encounter (Signed)
Spoke with patient she advised the Tramadol is to strong for her to take during the day. Patient asked if she can take the Tramadol at night and get something else prescribed for her during the day that's not as strong as Tramadol? Patient said she sleeps most of the day when she takes Tramadol and can't get anything done. The number to contact patient is [phone]

## 2020-01-21 NOTE — Telephone Encounter (Signed)
Please advise. Thanks.  

## 2020-01-21 NOTE — Telephone Encounter (Signed)
Pt called stating she was prescribed tramadol but it's too strong and keeps making her fall asleep so she would like something else sent in and she would like a CB to advise her what will be sent   [phone]

## 2020-01-22 NOTE — Telephone Encounter (Signed)
See previous message

## 2020-01-22 NOTE — Telephone Encounter (Signed)
The only things less strong are tylenol and nsaids.  I am happy to call in either

## 2020-01-26 ENCOUNTER — Telehealth: Payer: Self-pay

## 2020-01-26 ENCOUNTER — Other Ambulatory Visit: Payer: Self-pay | Admitting: Physician Assistant

## 2020-01-26 MED ORDER — DICLOFENAC SODIUM 75 MG PO TBEC: 75.0000 mg | DELAYED_RELEASE_TABLET | Freq: Two times a day (BID) | ORAL | 2 refills | Status: DC

## 2020-01-26 MED ORDER — ACETAMINOPHEN-CODEINE #3 300-30 MG PO TABS
1.0000 | ORAL_TABLET | Freq: Four times a day (QID) | ORAL | 0 refills | Status: DC | PRN
Start: 2020-01-26 — End: 2020-10-12

## 2020-01-26 MED ORDER — ONDANSETRON HCL 4 MG PO TABS: 4.0000 mg | ORAL_TABLET | Freq: Three times a day (TID) | ORAL | 0 refills | Status: DC | PRN

## 2020-01-26 NOTE — Telephone Encounter (Signed)
Patient called in saying she has been in a lot of pain and wants to get her mri moved up sooner than 12/12. She also wanted to speak about her pain that she is having and said she has some certains

## 2020-01-26 NOTE — Telephone Encounter (Signed)
Ok, she can just not pick up the tylenol 3.  I just sent in diclofenac

## 2020-01-26 NOTE — Telephone Encounter (Signed)
Pt is workers comp she will need to contact adjuister to see if they can have it moved

## 2020-01-26 NOTE — Telephone Encounter (Signed)
I sent in tylenol 3 and zofran

## 2020-01-26 NOTE — Telephone Encounter (Signed)
I spoke with patient. She is having significant pain and is unable to take the tramadol due to it making her so sick. She would appreciate anything that you are able to send in for her. She has been taking aleve with no relief. She has also asked that her MRI appt be moved up due to uncontrollable pain. I have sent Martie Lee a message to see if she is able to move it.

## 2020-01-26 NOTE — Telephone Encounter (Signed)
Patient would rather not try the tylenol #3 and would like for you to call in an anti inflammatory instead. She states if it is the naproxen that you recommend, possibly 250mg  instead of 500mg ?  The pain medications are making her very dizzy and she is feeling weird so she would rather not take them if possible.  Please advise.

## 2020-01-26 NOTE — Telephone Encounter (Signed)
I spoke with patient who is having considerable pain and is unable to take the tramadol given to her because it makes her so sick. She is requesting MRI be moved up from 02/08/2020. I tried to explain that we may not be able to get one sooner due to it being the end of year and holidays. She is willing to drive and requests as soon as possible.  Could you please check to see if you are able to get her into another facility sooner and let patient know? Thanks.

## 2020-01-26 NOTE — Telephone Encounter (Signed)
I called patient and advised. 

## 2020-01-28 NOTE — Telephone Encounter (Signed)
Forwarding to you. I am unsure if patient has been advised that W/C is going to send to another provider. Would you like for me to call her?

## 2020-01-28 NOTE — Telephone Encounter (Signed)
Christina Howell  This patient is under PMA with [facility] but, they are going to use [facility] for the MRI and also have Ms. Mccaffery go to another Ortho. Group for they thought Dr. Roda Shutters, MD worked for [facility]. Just a fyi. Thx

## 2020-02-08 ENCOUNTER — Other Ambulatory Visit: Payer: BC Managed Care – PPO

## 2020-02-10 ENCOUNTER — Ambulatory Visit: Payer: BC Managed Care – PPO | Admitting: Orthopaedic Surgery

## 2020-03-23 ENCOUNTER — Encounter: Payer: Self-pay | Admitting: Orthopaedic Surgery

## 2020-05-24 ENCOUNTER — Ambulatory Visit: Payer: Self-pay | Admitting: Surgery

## 2020-05-24 DIAGNOSIS — D1720 Benign lipomatous neoplasm of skin and subcutaneous tissue of unspecified limb: Secondary | ICD-10-CM | POA: Diagnosis not present

## 2020-05-24 NOTE — H&P (Signed)
Marcelino Duster Appointment: 05/24/2020 3:20 PM Location: [facility] Patient #: [id number] DOB: [birth year] Widowed / Language: [place] / Race: White Female  History of Present Illness Maisie Fus A. Lidia Clavijo MD; 05/24/2020 3:43 PM) Patient words: The patient returns for follow-up of her right arm lipoma. Location is the posterior aspect over her right triceps. Is not changed in size but is much more sore and she desires excision at this point due to discomfort. Soreness has developed the last 2-3 months. There is no redness or drainage.  The patient is a 65 year old female.   Problem List/Past Medical Marcelino Duster R. 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 Shon Baton, CMA; 05/24/2020 3:30 PM) Hysterectomy (not due to cancer) - Complete Oral Surgery  Diagnostic Studies History Marcelino Duster R. Shon Baton, CMA; 05/24/2020 3:30 PM) Colonoscopy within last year Mammogram within last year Pap Smear 1-5 years ago  Allergies Marcelino Duster R. Brooks, CMA; 05/24/2020 3:30 PM) No Known Allergies [04/07/2019]: No Known Drug Allergies [04/07/2019]:  Medication History Marcelino Duster R. Brooks, CMA; 05/24/2020 3:30 PM) Pantoprazole Sodium (40MG  Tablet DR, Oral) Active. Levothyroxine Sodium ( Tablet, Oral) Active. Ciclopirox (8% Solution, External) Active. metroNIDAZOLE (0.75% Gel, External) Active. Estradiol (0.5MG  Tablet, Oral) Active. Fluocinonide (External) Specific strength unknown - Active. Pepcid (20MG  Tablet, Oral) Active. Medications Reconciled  Social History R. Brooks, CMA; 05/24/2020 3:30 PM) Alcohol use Occasional alcohol use. No caffeine use No drug use Tobacco use Never smoker.  Family History Marcelino Duster R. 05/26/2020, CMA; 05/24/2020 3:30 PM) Arthritis Father. Cancer Father, Mother. Hypertension Father. Melanoma Mother. Respiratory Condition Father.  Pregnancy / Birth History Shon Baton R. 05/26/2020, CMA;  05/24/2020 3:30 PM) Age at menarche 17 years. Age of menopause <46 Contraceptive History Oral contraceptives. Gravida 3 Length (months) of breastfeeding 7-12 Maternal age 56-30 Para 2  Other Problems 05/26/2020 R. Brooks, CMA; 05/24/2020 3:30 PM) Gastroesophageal Reflux Disease Hemorrhoids Thyroid Disease    Vitals Marcelino Duster R. Brooks CMA; 05/24/2020 3:30 PM) 05/24/2020 3:30 PM Height: 68.5in        Physical Exam (Nikolaj Geraghty A. Demetre Monaco MD; 05/24/2020 3:44 PM)  Integumentary Note: Posterior aspect right upper arm over triceps muscle is a 2 x 3 similar movable subcutaneous nodule that is mildly tender. This is consistent with an angiolipoma  Head and Neck Head-normocephalic, atraumatic with no lesions or palpable masses.  Chest and Lung Exam Note: No wheezing work of breathing normal  Cardiovascular Note: RRR  Neurologic Neurologic evaluation reveals -alert and oriented x 3 with no impairment of recent or remote memory. Mental Status-Normal.    Assessment & Plan (Acquanetta Cabanilla A. 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 Risks and benefits discussed. Risk of bleeding, infection, nerve injury, blood vessel injury, need for revision or any additional surgery as well as complications of bleeding infection deep vein thrombosis cardiovascular anesthesia risk withdrawal quite low.     total time 20 minutes face to face, examining pt reviewing notes charts tests and documentation  Current Plans Pt Education - [facility] Free Text Education/Instructions: discussed with patient and provided information. Pt Education - [facility] General Post-op HCI The anatomy and the physiology was discussed. The pathophysiology and natural history of the disease was discussed. Options were discussed and recommendations were made. Technique, risks, benefits, & alternatives were  discussed. Risks such as stroke, heart attack, bleeding, indection, death, and other risks discussed. Questions answered. The  patient agrees to proceed. The pathophysiology of skin & subcutaneous masses was discussed. Natural history risks without surgery were discussed. I recommended surgery to remove the mass. I explained the technique of removal with use of local anesthesia & possible need for more aggressive sedation/anesthesia for patient comfort.  Risks such as bleeding, infection, wound breakdown, heart attack, death, and other risks were discussed. I noted a good likelihood this will help address the problem. Possibility that this will not correct all symptoms was explained. Possibility of regrowth/recurrence of the mass was discussed. We will work to minimize complications. Questions were answered. The patient expresses understanding & wishes to proceed with surgery.

## 2020-06-17 ENCOUNTER — Other Ambulatory Visit: Payer: Self-pay

## 2020-06-17 ENCOUNTER — Encounter (HOSPITAL_BASED_OUTPATIENT_CLINIC_OR_DEPARTMENT_OTHER): Payer: Self-pay | Admitting: Surgery

## 2020-06-21 ENCOUNTER — Other Ambulatory Visit (HOSPITAL_COMMUNITY): Payer: BC Managed Care – PPO

## 2020-06-24 ENCOUNTER — Ambulatory Visit (HOSPITAL_BASED_OUTPATIENT_CLINIC_OR_DEPARTMENT_OTHER): Admission: RE | Admit: 2020-06-24 | Payer: BC Managed Care – PPO | Source: Home / Self Care | Admitting: Surgery

## 2020-06-24 SURGERY — EXCISION MASS
Anesthesia: General | Laterality: Right

## 2020-07-14 ENCOUNTER — Other Ambulatory Visit: Payer: Self-pay | Admitting: Physician Assistant

## 2020-08-25 DIAGNOSIS — R252 Cramp and spasm: Secondary | ICD-10-CM | POA: Diagnosis not present

## 2020-09-09 DIAGNOSIS — H25013 Cortical age-related cataract, bilateral: Secondary | ICD-10-CM | POA: Diagnosis not present

## 2020-09-09 DIAGNOSIS — L719 Rosacea, unspecified: Secondary | ICD-10-CM | POA: Diagnosis not present

## 2020-09-09 DIAGNOSIS — H43393 Other vitreous opacities, bilateral: Secondary | ICD-10-CM | POA: Diagnosis not present

## 2020-09-27 ENCOUNTER — Other Ambulatory Visit: Payer: Self-pay

## 2020-09-27 DIAGNOSIS — I83813 Varicose veins of bilateral lower extremities with pain: Secondary | ICD-10-CM

## 2020-10-11 NOTE — Progress Notes (Signed)
[facility] [place]  ASSESSMENT / PLAN: Christina Howell is a 65 y.o. female with episodic spasm and discomfort of bilateral feet and calves. She has chronic venous insufficiency.  I counseled her that venous insufficiency is not the cause of her discomfort.  I counseled her about conservative measures including staying hydrated and maintaining adequate salt intake.  I encouraged her to follow-up with her primary care physician. Follow up with me as needed.  CHIEF COMPLAINT: Cramping in feet, history of intervention for varicose veins.  HISTORY OF PRESENT ILLNESS: Christina Howell is a 65 y.o. female who presents to clinic for evaluation of episodic discomfort in her bilateral feet.  She describes the pain as spasm-like and occasionally electric. She describes paresthesias about the feet as well. It is most noticeable at night, but can occur anytime. She denies any weakness in her legs. She was concerned that it may be a vascular problem because of her history of venous varicosities. She has no symptoms typical of claudication, ischemic rest pain. No ulcers about the feet.  Past Medical History:  Diagnosis Date   Eczema    dermatologist--- Dr. Lonni Fix    Heel spur 2009   by x-ray   Herniated disc    L4-L5 and T11-T12----has seen neurosurgeon Dr. Venetia Maxon   Plantar fasciitis    Varicose vein     Past Surgical History:  Procedure Laterality Date   TOTAL ABDOMINAL HYSTERECTOMY     VARICOSE VEIN SURGERY     WISDOM TOOTH EXTRACTION      Family History  Problem Relation Age of Onset   Emphysema Paternal Grandmother    Rheum arthritis Father     Social History   Socioeconomic History   Marital status: Married    Spouse name: Not on file   Number of children: 2   Years of education: Not on file   Highest education level: Not on file  Occupational History   Occupation: Magazine features editor: [employer]  Tobacco Use   Smoking status: Never   Smokeless tobacco:  Never  Vaping Use   Vaping Use: Never used  Substance and Sexual Activity   Alcohol use: Yes   Drug use: Never   Sexual activity: Not on file  Other Topics Concern   Not on file  Social History Narrative   Not on file   Social Determinants of Health   Financial Resource Strain: Not on file  Food Insecurity: Not on file  Transportation Needs: Not on file  Physical Activity: Not on file  Stress: Not on file  Social Connections: Not on file  Intimate Partner Violence: Not on file    Allergies  Allergen Reactions   Diphenhydramine Hcl Other (See Comments)    hyper    Current Outpatient Medications  Medication Sig Dispense Refill   baclofen (LIORESAL) 10 MG tablet Take 0.5-1 tablets (5-10 mg total) by mouth 3 (three) times daily as needed for muscle spasms. 30 each 3   cyclobenzaprine (FLEXERIL) 10 MG tablet Take 1 tablet (10 mg total) by mouth 3 (three) times daily as needed for muscle spasms. 90 tablet 1   diclofenac (VOLTAREN) 75 MG EC tablet Take 1 tablet (75 mg total) by mouth 2 (two) times daily. 60 tablet 2   estradiol (ESTRACE) 0.5 MG tablet Take 0.5 mg by mouth daily.     fluocinonide (LIDEX) 0.05 % cream Apply topically as directed.       levothyroxine (SYNTHROID, LEVOTHROID)  50 MCG tablet TAKE 1 TABLET BY MOUTH ON EMPTY STOMACH IN MORNING EVERY OTHER DAY ALTERNATING WITH  2   levothyroxine (SYNTHROID, LEVOTHROID) 75 MCG tablet TAKE 1 TABLET BY MOUTH ON AN EMPTY STOMACH IN MORNING EVERY OTHER DAY ALTERNATING WITH  4   methocarbamol (ROBAXIN) 500 MG tablet Take 1 tablet (500 mg total) by mouth 2 (two) times daily as needed. 20 tablet 0   nabumetone (RELAFEN) 750 MG tablet Take 1 tablet (750 mg total) by mouth 2 (two) times daily as needed. 60 tablet 6   naproxen (NAPROSYN) 500 MG tablet Take 1 tablet (500 mg total) by mouth 2 (two) times daily with a meal. 30 tablet 3   pantoprazole (PROTONIX) 20 MG tablet Take 20 mg by mouth daily.     No current  facility-administered medications for this visit.    REVIEW OF SYSTEMS:   denotes positive finding,  denotes negative finding Cardiac  Comments:  Chest pain or chest pressure:    Shortness of breath upon exertion:    Short of breath when lying flat:    Irregular heart rhythm:        Vascular    Pain in calf, thigh, or hip brought on by ambulation: x   Pain in feet at night that wakes you up from your sleep:  x   Blood clot in your veins:    Leg swelling:         Pulmonary    Oxygen at home:    Productive cough:     Wheezing:         Neurologic    Sudden weakness in arms or legs:  x   Sudden numbness in arms or legs:  x   Sudden onset of difficulty speaking or slurred speech:    Temporary loss of vision in one eye:     Problems with dizziness:         Gastrointestinal    Blood in stool:     Vomited blood:         Genitourinary    Burning when urinating:     Blood in urine:        Psychiatric    Major depression:         Hematologic    Bleeding problems:    Problems with blood clotting too easily:        Skin    Rashes or ulcers:        Constitutional    Fever or chills:      PHYSICAL EXAM Vitals:   10/12/20 1251  BP: 100/62  Pulse: (!) 57  Resp: 20  Temp: 98 F (36.7 C)  SpO2: 100%  Weight: 174 lb (78.9 kg)  Height: 5' 8.5" (1.74 m)    Constitutional: well appearing. no distress. Appears well nourished.  Neurologic: CN intact. no focal findings. no sensory loss. Psychiatric:  Mood and affect symmetric and appropriate. Eyes:  No icterus. No conjunctival pallor. Ears, nose, throat:  mucous membranes moist. Midline trachea.  Cardiac: regular rate and rhythm.  Respiratory:  unlabored. Abdominal:  soft, non-tender, non-distended.  Peripheral vascular: 2+ DP pulses bilaterally. Reticular veins about the medial ankles bilaterally. Scattered varicosities - most notable about the left foot Extremity: No edema. No cyanosis. No pallor.  Skin: No  gangrene. No ulceration.  Lymphatic: No Stemmer's sign. No palpable lymphadenopathy.  PERTINENT LABORATORY AND RADIOLOGIC DATA Lower Venous Reflux Study   Patient Name:  Christina Howell  Date of Exam:   10/12/2020  Medical Rec #: [id number]    Accession #:    [id number]  Date of Birth: 11/09/1955    Patient Gender: F  Patient Age:   32 years  Exam Location:  [facility]  Procedure:      VAS [place] LOWER EXTREMITY VENOUS REFLUX  Referring Phys: Heath Lark    ---------------------------------------------------------------------------  -----     Indications: Varicosities involving the bilateral lower extremity with  history of vein stripping.     Performing Technologist: Dorthula Matas RVS, RCS      Examination Guidelines: A complete evaluation includes B-mode imaging,  spectral  Doppler, color Doppler, and power Doppler as needed of all accessible  portions  of each vessel. Bilateral testing is considered an integral part of a  complete  examination. Limited examinations for reoccurring indications may be  performed  as noted. The reflux portion of the exam is performed with the patient in  reverse Trendelenburg.  Significant venous reflux is defined as >500 ms in the superficial venous  system, and >1 second in the deep venous system.      Venous Reflux Times  +--------------+---------+------+-----------+------------+-----------------  ----+  RIGHT         Reflux NoRefluxReflux TimeDiameter cmsComments                                        Yes                                                 +--------------+---------+------+-----------+------------+-----------------  ----+  CFV                     yes   >1 second                                     +--------------+---------+------+-----------+------------+-----------------  ----+  FV mid                  yes   >1 second                                      +--------------+---------+------+-----------+------------+-----------------  ----+  Popliteal               yes   >1 second                                     +--------------+---------+------+-----------+------------+-----------------  ----+  GSV at [facility]                                          prior  ablation/stripping     +--------------+---------+------+-----------+------------+-----------------  ----+  GSV prox thigh                                      prior                                                                          ablation/stripping     +--------------+---------+------+-----------+------------+-----------------  ----+  GSV mid thigh                                       prior                                                                          ablation/stripping     +--------------+---------+------+-----------+------------+-----------------  ----+  GSV dist thigh                                      prior                                                                          ablation/stripping     +--------------+---------+------+-----------+------------+-----------------  ----+  GSV at knee                                         prior                                                                          ablation/stripping     +--------------+---------+------+-----------+------------+-----------------  ----+  SSV prox calf           yes    >500 ms      0.30    only a short  segment                                                       of SSV in the  mid  calf                    +--------------+---------+------+-----------+------------+-----------------  ----+  pelvic branch           yes    >500 ms      1.21                             +--------------+---------+------+-----------+------------+-----------------  ----+      +--------------+---------+------+-----------+------------+-----------------  ----+  LEFT          Reflux NoRefluxReflux TimeDiameter cmsComments                                        Yes                                                 +--------------+---------+------+-----------+------------+-----------------  ----+  CFV                     yes   >1 second                                     +--------------+---------+------+-----------+------------+-----------------  ----+  FV mid                  yes   >1 second                                     +--------------+---------+------+-----------+------------+-----------------  ----+  Popliteal               yes   >1 second                                     +--------------+---------+------+-----------+------------+-----------------  ----+  GSV at [facility]              yes    >500 ms      0.75                            +--------------+---------+------+-----------+------------+-----------------  ----+  GSV prox thigh          yes    >500 ms      0.41                            +--------------+---------+------+-----------+------------+-----------------  ----+  GSV mid thigh                               0.44                            +--------------+---------+------+-----------+------------+-----------------  ----+  GSV dist thigh  prior                                                                          ablation/stripping     +--------------+---------+------+-----------+------------+-----------------  ----+  GSV at knee                                         prior                                                                          ablation/stripping      +--------------+---------+------+-----------+------------+-----------------  ----+  SSV prox calf           yes    >500 ms      0.21    tortuous with  only a                                                       short segment  within                                                       the fascia.              +--------------+---------+------+-----------+------------+-----------------  ----+           Summary:  Right:  - No evidence of deep vein thrombos from the common femoral through the  popliteal veins.  - No evidence of superficial venous thrombosis.   - The deep venous system is not competent.   - The great saphenous vein is not visualized.  - The small saphenous vein is not competent (notes above).     Left:  - No evidence of deep vein thrombosis from the common femoral through the  popliteal veins.   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   - The deep venous system is not competent.  - The great saphenous vein is not competent in the proximal thigh.  - The small saphenous vein is not competent (notes above).     *See table(s) above for measurements and observations.   Electronically signed by Heath Larkhomas Rhylan Kagel on 10/12/2020 at 4:02:00 PM.  Rande Brunthomas N. Lenell AntuHawken, MD [facility] Office Phone Number: [phone] 10/12/2020 4:13 PM

## 2020-10-12 ENCOUNTER — Encounter: Payer: Self-pay | Admitting: Vascular Surgery

## 2020-10-12 ENCOUNTER — Ambulatory Visit (HOSPITAL_COMMUNITY)
Admission: RE | Admit: 2020-10-12 | Discharge: 2020-10-12 | Disposition: A | Discharge status: Home / Self Care | Payer: BC Managed Care – PPO | Source: Ambulatory Visit | Attending: Vascular Surgery | Admitting: Vascular Surgery

## 2020-10-12 ENCOUNTER — Ambulatory Visit (INDEPENDENT_AMBULATORY_CARE_PROVIDER_SITE_OTHER): Payer: BC Managed Care – PPO | Admitting: Vascular Surgery

## 2020-10-12 ENCOUNTER — Other Ambulatory Visit: Payer: Self-pay

## 2020-10-12 VITALS — BP 100/62 | HR 57 | Temp 98.0°F | Resp 20 | Ht 68.5 in | Wt 174.0 lb

## 2020-10-12 DIAGNOSIS — I872 Venous insufficiency (chronic) (peripheral): Secondary | ICD-10-CM | POA: Diagnosis not present

## 2020-10-12 DIAGNOSIS — I83813 Varicose veins of bilateral lower extremities with pain: Secondary | ICD-10-CM | POA: Diagnosis not present

## 2020-10-15 DIAGNOSIS — M2011 Hallux valgus (acquired), right foot: Secondary | ICD-10-CM | POA: Diagnosis not present

## 2020-10-15 DIAGNOSIS — M2012 Hallux valgus (acquired), left foot: Secondary | ICD-10-CM | POA: Diagnosis not present

## 2020-10-15 DIAGNOSIS — M6702 Short Achilles tendon (acquired), left ankle: Secondary | ICD-10-CM | POA: Diagnosis not present

## 2020-10-15 DIAGNOSIS — M7662 Achilles tendinitis, left leg: Secondary | ICD-10-CM | POA: Diagnosis not present

## 2020-10-26 DIAGNOSIS — M545 Low back pain, unspecified: Secondary | ICD-10-CM | POA: Diagnosis not present

## 2020-10-27 DIAGNOSIS — D696 Thrombocytopenia, unspecified: Secondary | ICD-10-CM | POA: Diagnosis not present

## 2020-10-27 DIAGNOSIS — M2550 Pain in unspecified joint: Secondary | ICD-10-CM | POA: Diagnosis not present

## 2020-10-27 DIAGNOSIS — E7800 Pure hypercholesterolemia, unspecified: Secondary | ICD-10-CM | POA: Diagnosis not present

## 2020-10-27 DIAGNOSIS — Z0000 Encounter for general adult medical examination without abnormal findings: Secondary | ICD-10-CM | POA: Diagnosis not present

## 2020-10-27 DIAGNOSIS — E039 Hypothyroidism, unspecified: Secondary | ICD-10-CM | POA: Diagnosis not present

## 2020-11-30 ENCOUNTER — Other Ambulatory Visit: Payer: Self-pay | Admitting: Home Modifications

## 2020-11-30 DIAGNOSIS — Z1231 Encounter for screening mammogram for malignant neoplasm of breast: Secondary | ICD-10-CM

## 2020-12-28 DIAGNOSIS — M5137 Other intervertebral disc degeneration, lumbosacral region: Secondary | ICD-10-CM | POA: Diagnosis not present

## 2020-12-28 DIAGNOSIS — M5136 Other intervertebral disc degeneration, lumbar region: Secondary | ICD-10-CM | POA: Diagnosis not present

## 2020-12-29 ENCOUNTER — Ambulatory Visit
Admission: RE | Admit: 2020-12-29 | Discharge: 2020-12-29 | Disposition: A | Discharge status: Home / Self Care | Payer: BC Managed Care – PPO | Source: Ambulatory Visit | Attending: Home Modifications | Admitting: Home Modifications

## 2020-12-29 DIAGNOSIS — Z1231 Encounter for screening mammogram for malignant neoplasm of breast: Secondary | ICD-10-CM | POA: Diagnosis not present

## 2021-01-03 DIAGNOSIS — H25013 Cortical age-related cataract, bilateral: Secondary | ICD-10-CM | POA: Diagnosis not present

## 2021-01-03 DIAGNOSIS — H43393 Other vitreous opacities, bilateral: Secondary | ICD-10-CM | POA: Diagnosis not present

## 2021-01-03 DIAGNOSIS — L719 Rosacea, unspecified: Secondary | ICD-10-CM | POA: Diagnosis not present

## 2021-02-23 DIAGNOSIS — D2271 Melanocytic nevi of right lower limb, including hip: Secondary | ICD-10-CM | POA: Diagnosis not present

## 2021-02-23 DIAGNOSIS — D2272 Melanocytic nevi of left lower limb, including hip: Secondary | ICD-10-CM | POA: Diagnosis not present

## 2021-02-23 DIAGNOSIS — D0359 Melanoma in situ of other part of trunk: Secondary | ICD-10-CM | POA: Diagnosis not present

## 2021-02-23 DIAGNOSIS — L821 Other seborrheic keratosis: Secondary | ICD-10-CM | POA: Diagnosis not present

## 2021-02-23 DIAGNOSIS — C4359 Malignant melanoma of other part of trunk: Secondary | ICD-10-CM | POA: Diagnosis not present

## 2021-02-23 DIAGNOSIS — D485 Neoplasm of uncertain behavior of skin: Secondary | ICD-10-CM | POA: Diagnosis not present

## 2021-02-23 DIAGNOSIS — D225 Melanocytic nevi of trunk: Secondary | ICD-10-CM | POA: Diagnosis not present

## 2021-02-23 DIAGNOSIS — L718 Other rosacea: Secondary | ICD-10-CM | POA: Diagnosis not present

## 2021-02-23 DIAGNOSIS — L814 Other melanin hyperpigmentation: Secondary | ICD-10-CM | POA: Diagnosis not present

## 2021-03-29 DIAGNOSIS — L905 Scar conditions and fibrosis of skin: Secondary | ICD-10-CM | POA: Diagnosis not present

## 2021-03-29 DIAGNOSIS — C4359 Malignant melanoma of other part of trunk: Secondary | ICD-10-CM | POA: Diagnosis not present

## 2021-03-29 DIAGNOSIS — L989 Disorder of the skin and subcutaneous tissue, unspecified: Secondary | ICD-10-CM | POA: Diagnosis not present

## 2021-04-25 DIAGNOSIS — U071 COVID-19: Secondary | ICD-10-CM | POA: Diagnosis not present

## 2021-05-02 DIAGNOSIS — H43393 Other vitreous opacities, bilateral: Secondary | ICD-10-CM | POA: Diagnosis not present

## 2021-05-02 DIAGNOSIS — H25013 Cortical age-related cataract, bilateral: Secondary | ICD-10-CM | POA: Diagnosis not present

## 2021-05-02 DIAGNOSIS — L719 Rosacea, unspecified: Secondary | ICD-10-CM | POA: Diagnosis not present

## 2021-05-04 DIAGNOSIS — U071 COVID-19: Secondary | ICD-10-CM | POA: Diagnosis not present

## 2021-05-04 DIAGNOSIS — J029 Acute pharyngitis, unspecified: Secondary | ICD-10-CM | POA: Diagnosis not present

## 2021-05-10 DIAGNOSIS — J029 Acute pharyngitis, unspecified: Secondary | ICD-10-CM | POA: Diagnosis not present

## 2021-05-10 DIAGNOSIS — J019 Acute sinusitis, unspecified: Secondary | ICD-10-CM | POA: Diagnosis not present

## 2021-05-10 DIAGNOSIS — J40 Bronchitis, not specified as acute or chronic: Secondary | ICD-10-CM | POA: Diagnosis not present

## 2021-05-26 DIAGNOSIS — H2512 Age-related nuclear cataract, left eye: Secondary | ICD-10-CM | POA: Diagnosis not present

## 2021-06-07 DIAGNOSIS — H2511 Age-related nuclear cataract, right eye: Secondary | ICD-10-CM | POA: Diagnosis not present

## 2021-06-09 DIAGNOSIS — H2511 Age-related nuclear cataract, right eye: Secondary | ICD-10-CM | POA: Diagnosis not present

## 2021-06-14 DIAGNOSIS — D692 Other nonthrombocytopenic purpura: Secondary | ICD-10-CM | POA: Diagnosis not present

## 2021-06-14 DIAGNOSIS — L821 Other seborrheic keratosis: Secondary | ICD-10-CM | POA: Diagnosis not present

## 2021-06-14 DIAGNOSIS — L820 Inflamed seborrheic keratosis: Secondary | ICD-10-CM | POA: Diagnosis not present

## 2021-06-14 DIAGNOSIS — D225 Melanocytic nevi of trunk: Secondary | ICD-10-CM | POA: Diagnosis not present

## 2021-06-14 DIAGNOSIS — L814 Other melanin hyperpigmentation: Secondary | ICD-10-CM | POA: Diagnosis not present

## 2021-06-16 DIAGNOSIS — E039 Hypothyroidism, unspecified: Secondary | ICD-10-CM | POA: Diagnosis not present

## 2021-07-12 DIAGNOSIS — H524 Presbyopia: Secondary | ICD-10-CM | POA: Diagnosis not present

## 2021-07-12 DIAGNOSIS — Z961 Presence of intraocular lens: Secondary | ICD-10-CM | POA: Diagnosis not present

## 2021-09-26 DIAGNOSIS — D1721 Benign lipomatous neoplasm of skin and subcutaneous tissue of right arm: Secondary | ICD-10-CM | POA: Diagnosis not present

## 2021-09-27 DIAGNOSIS — L718 Other rosacea: Secondary | ICD-10-CM | POA: Diagnosis not present

## 2021-09-27 DIAGNOSIS — L821 Other seborrheic keratosis: Secondary | ICD-10-CM | POA: Diagnosis not present

## 2021-09-27 DIAGNOSIS — D2271 Melanocytic nevi of right lower limb, including hip: Secondary | ICD-10-CM | POA: Diagnosis not present

## 2021-09-27 DIAGNOSIS — L814 Other melanin hyperpigmentation: Secondary | ICD-10-CM | POA: Diagnosis not present

## 2021-09-27 DIAGNOSIS — D225 Melanocytic nevi of trunk: Secondary | ICD-10-CM | POA: Diagnosis not present

## 2021-10-03 DIAGNOSIS — Z961 Presence of intraocular lens: Secondary | ICD-10-CM | POA: Diagnosis not present

## 2021-10-03 DIAGNOSIS — L719 Rosacea, unspecified: Secondary | ICD-10-CM | POA: Diagnosis not present

## 2021-10-03 DIAGNOSIS — H43811 Vitreous degeneration, right eye: Secondary | ICD-10-CM | POA: Diagnosis not present

## 2021-11-21 DIAGNOSIS — Z1331 Encounter for screening for depression: Secondary | ICD-10-CM | POA: Diagnosis not present

## 2021-11-21 DIAGNOSIS — Z0000 Encounter for general adult medical examination without abnormal findings: Secondary | ICD-10-CM | POA: Diagnosis not present

## 2021-11-21 DIAGNOSIS — E538 Deficiency of other specified B group vitamins: Secondary | ICD-10-CM | POA: Diagnosis not present

## 2021-11-21 DIAGNOSIS — E7800 Pure hypercholesterolemia, unspecified: Secondary | ICD-10-CM | POA: Diagnosis not present

## 2021-11-21 DIAGNOSIS — E039 Hypothyroidism, unspecified: Secondary | ICD-10-CM | POA: Diagnosis not present

## 2021-11-22 ENCOUNTER — Other Ambulatory Visit: Payer: Self-pay | Admitting: Family Medicine

## 2021-11-22 DIAGNOSIS — E2839 Other primary ovarian failure: Secondary | ICD-10-CM

## 2021-11-23 ENCOUNTER — Other Ambulatory Visit: Payer: Self-pay | Admitting: Family Medicine

## 2021-11-23 DIAGNOSIS — Z1231 Encounter for screening mammogram for malignant neoplasm of breast: Secondary | ICD-10-CM

## 2021-11-28 ENCOUNTER — Ambulatory Visit
Admission: RE | Admit: 2021-11-28 | Discharge: 2021-11-28 | Disposition: A | Discharge status: Home / Self Care | Payer: Medicare Other | Source: Ambulatory Visit | Attending: Family Medicine | Admitting: Family Medicine

## 2021-11-28 DIAGNOSIS — E2839 Other primary ovarian failure: Secondary | ICD-10-CM

## 2021-11-28 DIAGNOSIS — Z780 Asymptomatic menopausal state: Secondary | ICD-10-CM | POA: Diagnosis not present

## 2021-12-06 ENCOUNTER — Other Ambulatory Visit: Payer: Self-pay | Admitting: Surgery

## 2021-12-06 DIAGNOSIS — D1721 Benign lipomatous neoplasm of skin and subcutaneous tissue of right arm: Secondary | ICD-10-CM | POA: Diagnosis not present

## 2021-12-09 ENCOUNTER — Encounter: Payer: Self-pay | Admitting: Surgery

## 2021-12-30 ENCOUNTER — Ambulatory Visit
Admission: RE | Admit: 2021-12-30 | Discharge: 2021-12-30 | Disposition: A | Discharge status: Home / Self Care | Payer: Medicare Other | Source: Ambulatory Visit | Attending: Family Medicine | Admitting: Family Medicine

## 2021-12-30 DIAGNOSIS — Z1231 Encounter for screening mammogram for malignant neoplasm of breast: Secondary | ICD-10-CM

## 2022-01-24 DIAGNOSIS — D225 Melanocytic nevi of trunk: Secondary | ICD-10-CM | POA: Diagnosis not present

## 2022-01-24 DIAGNOSIS — L821 Other seborrheic keratosis: Secondary | ICD-10-CM | POA: Diagnosis not present

## 2022-01-24 DIAGNOSIS — C4441 Basal cell carcinoma of skin of scalp and neck: Secondary | ICD-10-CM | POA: Diagnosis not present

## 2022-01-24 DIAGNOSIS — L918 Other hypertrophic disorders of the skin: Secondary | ICD-10-CM | POA: Diagnosis not present

## 2022-01-24 DIAGNOSIS — L814 Other melanin hyperpigmentation: Secondary | ICD-10-CM | POA: Diagnosis not present

## 2022-02-06 DIAGNOSIS — C44519 Basal cell carcinoma of skin of other part of trunk: Secondary | ICD-10-CM | POA: Diagnosis not present

## 2022-02-22 DIAGNOSIS — E039 Hypothyroidism, unspecified: Secondary | ICD-10-CM | POA: Diagnosis not present

## 2022-02-22 DIAGNOSIS — E538 Deficiency of other specified B group vitamins: Secondary | ICD-10-CM | POA: Diagnosis not present

## 2022-04-11 DIAGNOSIS — H6123 Impacted cerumen, bilateral: Secondary | ICD-10-CM | POA: Diagnosis not present

## 2022-04-12 DIAGNOSIS — Z961 Presence of intraocular lens: Secondary | ICD-10-CM | POA: Diagnosis not present

## 2022-04-12 DIAGNOSIS — L719 Rosacea, unspecified: Secondary | ICD-10-CM | POA: Diagnosis not present

## 2022-04-12 DIAGNOSIS — H43811 Vitreous degeneration, right eye: Secondary | ICD-10-CM | POA: Diagnosis not present

## 2022-04-12 DIAGNOSIS — H524 Presbyopia: Secondary | ICD-10-CM | POA: Diagnosis not present

## 2022-05-03 DIAGNOSIS — E039 Hypothyroidism, unspecified: Secondary | ICD-10-CM | POA: Diagnosis not present

## 2022-05-03 DIAGNOSIS — K219 Gastro-esophageal reflux disease without esophagitis: Secondary | ICD-10-CM | POA: Diagnosis not present

## 2022-05-03 DIAGNOSIS — E538 Deficiency of other specified B group vitamins: Secondary | ICD-10-CM | POA: Diagnosis not present

## 2022-05-03 DIAGNOSIS — N951 Menopausal and female climacteric states: Secondary | ICD-10-CM | POA: Diagnosis not present

## 2022-05-03 DIAGNOSIS — Z6829 Body mass index (BMI) 29.0-29.9, adult: Secondary | ICD-10-CM | POA: Diagnosis not present

## 2022-07-27 DIAGNOSIS — D225 Melanocytic nevi of trunk: Secondary | ICD-10-CM | POA: Diagnosis not present

## 2022-07-27 DIAGNOSIS — L910 Hypertrophic scar: Secondary | ICD-10-CM | POA: Diagnosis not present

## 2022-07-27 DIAGNOSIS — L814 Other melanin hyperpigmentation: Secondary | ICD-10-CM | POA: Diagnosis not present

## 2022-07-27 DIAGNOSIS — L821 Other seborrheic keratosis: Secondary | ICD-10-CM | POA: Diagnosis not present

## 2022-09-01 DIAGNOSIS — U071 COVID-19: Secondary | ICD-10-CM | POA: Diagnosis not present

## 2022-09-14 DIAGNOSIS — K080 Exfoliation of teeth due to systemic causes: Secondary | ICD-10-CM | POA: Diagnosis not present

## 2022-10-16 DIAGNOSIS — M545 Low back pain, unspecified: Secondary | ICD-10-CM | POA: Diagnosis not present

## 2022-10-16 DIAGNOSIS — M7918 Myalgia, other site: Secondary | ICD-10-CM | POA: Diagnosis not present

## 2022-11-16 ENCOUNTER — Other Ambulatory Visit: Payer: Self-pay | Admitting: Family Medicine

## 2022-11-16 DIAGNOSIS — Z1231 Encounter for screening mammogram for malignant neoplasm of breast: Secondary | ICD-10-CM

## 2022-11-21 DIAGNOSIS — M71572 Other bursitis, not elsewhere classified, left ankle and foot: Secondary | ICD-10-CM | POA: Diagnosis not present

## 2022-11-21 DIAGNOSIS — M7732 Calcaneal spur, left foot: Secondary | ICD-10-CM | POA: Diagnosis not present

## 2022-11-21 DIAGNOSIS — M722 Plantar fascial fibromatosis: Secondary | ICD-10-CM | POA: Diagnosis not present

## 2022-11-21 DIAGNOSIS — M79672 Pain in left foot: Secondary | ICD-10-CM | POA: Diagnosis not present

## 2022-11-27 DIAGNOSIS — N951 Menopausal and female climacteric states: Secondary | ICD-10-CM | POA: Diagnosis not present

## 2022-11-27 DIAGNOSIS — Z0000 Encounter for general adult medical examination without abnormal findings: Secondary | ICD-10-CM | POA: Diagnosis not present

## 2022-11-27 DIAGNOSIS — E039 Hypothyroidism, unspecified: Secondary | ICD-10-CM | POA: Diagnosis not present

## 2022-11-27 DIAGNOSIS — E538 Deficiency of other specified B group vitamins: Secondary | ICD-10-CM | POA: Diagnosis not present

## 2022-11-27 DIAGNOSIS — B351 Tinea unguium: Secondary | ICD-10-CM | POA: Diagnosis not present

## 2022-11-27 DIAGNOSIS — E7800 Pure hypercholesterolemia, unspecified: Secondary | ICD-10-CM | POA: Diagnosis not present

## 2022-11-27 DIAGNOSIS — D692 Other nonthrombocytopenic purpura: Secondary | ICD-10-CM | POA: Diagnosis not present

## 2022-11-28 DIAGNOSIS — M722 Plantar fascial fibromatosis: Secondary | ICD-10-CM | POA: Diagnosis not present

## 2022-11-28 DIAGNOSIS — M71572 Other bursitis, not elsewhere classified, left ankle and foot: Secondary | ICD-10-CM | POA: Diagnosis not present

## 2022-12-12 DIAGNOSIS — M722 Plantar fascial fibromatosis: Secondary | ICD-10-CM | POA: Diagnosis not present

## 2022-12-14 DIAGNOSIS — M722 Plantar fascial fibromatosis: Secondary | ICD-10-CM | POA: Diagnosis not present

## 2022-12-14 DIAGNOSIS — M71571 Other bursitis, not elsewhere classified, right ankle and foot: Secondary | ICD-10-CM | POA: Diagnosis not present

## 2022-12-14 DIAGNOSIS — M7731 Calcaneal spur, right foot: Secondary | ICD-10-CM | POA: Diagnosis not present

## 2022-12-20 DIAGNOSIS — M25572 Pain in left ankle and joints of left foot: Secondary | ICD-10-CM | POA: Diagnosis not present

## 2022-12-20 DIAGNOSIS — M6281 Muscle weakness (generalized): Secondary | ICD-10-CM | POA: Diagnosis not present

## 2022-12-20 DIAGNOSIS — M25571 Pain in right ankle and joints of right foot: Secondary | ICD-10-CM | POA: Diagnosis not present

## 2022-12-20 DIAGNOSIS — M71571 Other bursitis, not elsewhere classified, right ankle and foot: Secondary | ICD-10-CM | POA: Diagnosis not present

## 2022-12-25 DIAGNOSIS — M25571 Pain in right ankle and joints of right foot: Secondary | ICD-10-CM | POA: Diagnosis not present

## 2022-12-25 DIAGNOSIS — M6281 Muscle weakness (generalized): Secondary | ICD-10-CM | POA: Diagnosis not present

## 2022-12-25 DIAGNOSIS — M25572 Pain in left ankle and joints of left foot: Secondary | ICD-10-CM | POA: Diagnosis not present

## 2022-12-25 DIAGNOSIS — M71571 Other bursitis, not elsewhere classified, right ankle and foot: Secondary | ICD-10-CM | POA: Diagnosis not present

## 2022-12-28 DIAGNOSIS — M25571 Pain in right ankle and joints of right foot: Secondary | ICD-10-CM | POA: Diagnosis not present

## 2022-12-28 DIAGNOSIS — M7731 Calcaneal spur, right foot: Secondary | ICD-10-CM | POA: Diagnosis not present

## 2022-12-28 DIAGNOSIS — M71571 Other bursitis, not elsewhere classified, right ankle and foot: Secondary | ICD-10-CM | POA: Diagnosis not present

## 2022-12-28 DIAGNOSIS — M25572 Pain in left ankle and joints of left foot: Secondary | ICD-10-CM | POA: Diagnosis not present

## 2022-12-28 DIAGNOSIS — M722 Plantar fascial fibromatosis: Secondary | ICD-10-CM | POA: Diagnosis not present

## 2022-12-28 DIAGNOSIS — M6281 Muscle weakness (generalized): Secondary | ICD-10-CM | POA: Diagnosis not present

## 2023-01-01 ENCOUNTER — Ambulatory Visit
Admission: RE | Admit: 2023-01-01 | Discharge: 2023-01-01 | Disposition: A | Discharge status: Home / Self Care | Payer: Medicare Other | Source: Ambulatory Visit | Attending: Family Medicine | Admitting: Family Medicine

## 2023-01-01 DIAGNOSIS — M25571 Pain in right ankle and joints of right foot: Secondary | ICD-10-CM | POA: Diagnosis not present

## 2023-01-01 DIAGNOSIS — M6281 Muscle weakness (generalized): Secondary | ICD-10-CM | POA: Diagnosis not present

## 2023-01-01 DIAGNOSIS — M722 Plantar fascial fibromatosis: Secondary | ICD-10-CM | POA: Diagnosis not present

## 2023-01-01 DIAGNOSIS — M7731 Calcaneal spur, right foot: Secondary | ICD-10-CM | POA: Diagnosis not present

## 2023-01-01 DIAGNOSIS — M71571 Other bursitis, not elsewhere classified, right ankle and foot: Secondary | ICD-10-CM | POA: Diagnosis not present

## 2023-01-01 DIAGNOSIS — Z1231 Encounter for screening mammogram for malignant neoplasm of breast: Secondary | ICD-10-CM

## 2023-01-01 DIAGNOSIS — M25572 Pain in left ankle and joints of left foot: Secondary | ICD-10-CM | POA: Diagnosis not present

## 2023-01-04 DIAGNOSIS — M722 Plantar fascial fibromatosis: Secondary | ICD-10-CM | POA: Diagnosis not present

## 2023-01-04 DIAGNOSIS — M25571 Pain in right ankle and joints of right foot: Secondary | ICD-10-CM | POA: Diagnosis not present

## 2023-01-04 DIAGNOSIS — M6281 Muscle weakness (generalized): Secondary | ICD-10-CM | POA: Diagnosis not present

## 2023-01-04 DIAGNOSIS — M7731 Calcaneal spur, right foot: Secondary | ICD-10-CM | POA: Diagnosis not present

## 2023-01-04 DIAGNOSIS — M71571 Other bursitis, not elsewhere classified, right ankle and foot: Secondary | ICD-10-CM | POA: Diagnosis not present

## 2023-01-04 DIAGNOSIS — M25572 Pain in left ankle and joints of left foot: Secondary | ICD-10-CM | POA: Diagnosis not present

## 2023-01-08 DIAGNOSIS — M25571 Pain in right ankle and joints of right foot: Secondary | ICD-10-CM | POA: Diagnosis not present

## 2023-01-08 DIAGNOSIS — M71571 Other bursitis, not elsewhere classified, right ankle and foot: Secondary | ICD-10-CM | POA: Diagnosis not present

## 2023-01-08 DIAGNOSIS — M25572 Pain in left ankle and joints of left foot: Secondary | ICD-10-CM | POA: Diagnosis not present

## 2023-01-08 DIAGNOSIS — M6281 Muscle weakness (generalized): Secondary | ICD-10-CM | POA: Diagnosis not present

## 2023-01-09 DIAGNOSIS — H6121 Impacted cerumen, right ear: Secondary | ICD-10-CM | POA: Diagnosis not present

## 2023-01-09 DIAGNOSIS — R350 Frequency of micturition: Secondary | ICD-10-CM | POA: Diagnosis not present

## 2023-01-10 DIAGNOSIS — H903 Sensorineural hearing loss, bilateral: Secondary | ICD-10-CM | POA: Diagnosis not present

## 2023-01-11 DIAGNOSIS — M25571 Pain in right ankle and joints of right foot: Secondary | ICD-10-CM | POA: Diagnosis not present

## 2023-01-11 DIAGNOSIS — M6281 Muscle weakness (generalized): Secondary | ICD-10-CM | POA: Diagnosis not present

## 2023-01-11 DIAGNOSIS — M25572 Pain in left ankle and joints of left foot: Secondary | ICD-10-CM | POA: Diagnosis not present

## 2023-01-11 DIAGNOSIS — M71571 Other bursitis, not elsewhere classified, right ankle and foot: Secondary | ICD-10-CM | POA: Diagnosis not present

## 2023-01-12 ENCOUNTER — Emergency Department (HOSPITAL_COMMUNITY): Payer: Medicare Other

## 2023-01-12 ENCOUNTER — Other Ambulatory Visit: Payer: Self-pay

## 2023-01-12 ENCOUNTER — Inpatient Hospital Stay (HOSPITAL_COMMUNITY)
Admission: EM | Admit: 2023-01-12 | Discharge: 2023-01-23 | DRG: 914 | Disposition: A | Discharge status: Skilled Nursing Facility | Payer: Medicare Other | Attending: Family Medicine | Admitting: Family Medicine

## 2023-01-12 DIAGNOSIS — Z7401 Bed confinement status: Secondary | ICD-10-CM | POA: Diagnosis not present

## 2023-01-12 DIAGNOSIS — Z90710 Acquired absence of both cervix and uterus: Secondary | ICD-10-CM

## 2023-01-12 DIAGNOSIS — E039 Hypothyroidism, unspecified: Secondary | ICD-10-CM | POA: Diagnosis present

## 2023-01-12 DIAGNOSIS — W19XXXA Unspecified fall, initial encounter: Principal | ICD-10-CM

## 2023-01-12 DIAGNOSIS — Z9181 History of falling: Secondary | ICD-10-CM | POA: Diagnosis not present

## 2023-01-12 DIAGNOSIS — S76392A Other specified injury of muscle, fascia and tendon of the posterior muscle group at thigh level, left thigh, initial encounter: Principal | ICD-10-CM | POA: Diagnosis present

## 2023-01-12 DIAGNOSIS — Z9889 Other specified postprocedural states: Secondary | ICD-10-CM | POA: Diagnosis not present

## 2023-01-12 DIAGNOSIS — Y9354 Activity, bowling: Secondary | ICD-10-CM | POA: Diagnosis not present

## 2023-01-12 DIAGNOSIS — M25552 Pain in left hip: Secondary | ICD-10-CM | POA: Diagnosis not present

## 2023-01-12 DIAGNOSIS — R32 Unspecified urinary incontinence: Secondary | ICD-10-CM | POA: Diagnosis present

## 2023-01-12 DIAGNOSIS — Z8261 Family history of arthritis: Secondary | ICD-10-CM

## 2023-01-12 DIAGNOSIS — E876 Hypokalemia: Secondary | ICD-10-CM | POA: Diagnosis present

## 2023-01-12 DIAGNOSIS — Z79890 Hormone replacement therapy (postmenopausal): Secondary | ICD-10-CM | POA: Diagnosis not present

## 2023-01-12 DIAGNOSIS — Z888 Allergy status to other drugs, medicaments and biological substances status: Secondary | ICD-10-CM

## 2023-01-12 DIAGNOSIS — S76399A Other specified injury of muscle, fascia and tendon of the posterior muscle group at thigh level, unspecified thigh, initial encounter: Secondary | ICD-10-CM | POA: Diagnosis not present

## 2023-01-12 DIAGNOSIS — W010XXA Fall on same level from slipping, tripping and stumbling without subsequent striking against object, initial encounter: Secondary | ICD-10-CM | POA: Diagnosis not present

## 2023-01-12 DIAGNOSIS — Y9239 Other specified sports and athletic area as the place of occurrence of the external cause: Secondary | ICD-10-CM

## 2023-01-12 DIAGNOSIS — Z825 Family history of asthma and other chronic lower respiratory diseases: Secondary | ICD-10-CM

## 2023-01-12 DIAGNOSIS — K219 Gastro-esophageal reflux disease without esophagitis: Secondary | ICD-10-CM | POA: Diagnosis present

## 2023-01-12 DIAGNOSIS — F419 Anxiety disorder, unspecified: Secondary | ICD-10-CM | POA: Diagnosis not present

## 2023-01-12 DIAGNOSIS — M79652 Pain in left thigh: Secondary | ICD-10-CM | POA: Diagnosis not present

## 2023-01-12 DIAGNOSIS — T148XXA Other injury of unspecified body region, initial encounter: Secondary | ICD-10-CM | POA: Diagnosis not present

## 2023-01-12 DIAGNOSIS — S76312A Strain of muscle, fascia and tendon of the posterior muscle group at thigh level, left thigh, initial encounter: Secondary | ICD-10-CM | POA: Diagnosis not present

## 2023-01-12 DIAGNOSIS — M5124 Other intervertebral disc displacement, thoracic region: Secondary | ICD-10-CM | POA: Diagnosis not present

## 2023-01-12 DIAGNOSIS — M6281 Muscle weakness (generalized): Secondary | ICD-10-CM | POA: Diagnosis not present

## 2023-01-12 DIAGNOSIS — Z791 Long term (current) use of non-steroidal anti-inflammatories (NSAID): Secondary | ICD-10-CM | POA: Diagnosis not present

## 2023-01-12 DIAGNOSIS — I959 Hypotension, unspecified: Secondary | ICD-10-CM | POA: Diagnosis not present

## 2023-01-12 DIAGNOSIS — Z79899 Other long term (current) drug therapy: Secondary | ICD-10-CM

## 2023-01-12 DIAGNOSIS — S76392D Other specified injury of muscle, fascia and tendon of the posterior muscle group at thigh level, left thigh, subsequent encounter: Secondary | ICD-10-CM | POA: Diagnosis not present

## 2023-01-12 DIAGNOSIS — M79605 Pain in left leg: Secondary | ICD-10-CM | POA: Diagnosis not present

## 2023-01-12 DIAGNOSIS — M5126 Other intervertebral disc displacement, lumbar region: Secondary | ICD-10-CM | POA: Diagnosis not present

## 2023-01-12 DIAGNOSIS — I10 Essential (primary) hypertension: Secondary | ICD-10-CM | POA: Diagnosis not present

## 2023-01-12 DIAGNOSIS — S3993XA Unspecified injury of pelvis, initial encounter: Secondary | ICD-10-CM | POA: Diagnosis not present

## 2023-01-12 DIAGNOSIS — R262 Difficulty in walking, not elsewhere classified: Secondary | ICD-10-CM | POA: Diagnosis not present

## 2023-01-12 HISTORY — DX: Hypothyroidism, unspecified: E03.9

## 2023-01-12 HISTORY — DX: Unspecified urinary incontinence: R32

## 2023-01-12 HISTORY — DX: Gastro-esophageal reflux disease without esophagitis: K21.9

## 2023-01-12 LAB — CBC WITH DIFFERENTIAL/PLATELET
Abs Immature Granulocytes: 0.02 10*3/uL (ref 0.00–0.07)
Basophils Absolute: 0 10*3/uL (ref 0.0–0.1)
Basophils Relative: 0 %
Eosinophils Absolute: 0.1 10*3/uL (ref 0.0–0.5)
Eosinophils Relative: 1 %
HCT: 34.4 % — ABNORMAL LOW (ref 36.0–46.0)
Hemoglobin: 11.6 g/dL — ABNORMAL LOW (ref 12.0–15.0)
Immature Granulocytes: 0 %
Lymphocytes Relative: 28 %
Lymphs Abs: 2 10*3/uL (ref 0.7–4.0)
MCH: 31.2 pg (ref 26.0–34.0)
MCHC: 33.7 g/dL (ref 30.0–36.0)
MCV: 92.5 fL (ref 80.0–100.0)
Monocytes Absolute: 0.5 10*3/uL (ref 0.1–1.0)
Monocytes Relative: 7 %
Neutro Abs: 4.5 10*3/uL (ref 1.7–7.7)
Neutrophils Relative %: 64 %
Platelets: 163 10*3/uL (ref 150–400)
RBC: 3.72 MIL/uL — ABNORMAL LOW (ref 3.87–5.11)
RDW: 12.1 % (ref 11.5–15.5)
WBC: 7 10*3/uL (ref 4.0–10.5)
nRBC: 0 % (ref 0.0–0.2)

## 2023-01-12 LAB — BASIC METABOLIC PANEL
Anion gap: 7 (ref 5–15)
BUN: 14 mg/dL (ref 8–23)
CO2: 23 mmol/L (ref 22–32)
Calcium: 8 mg/dL — ABNORMAL LOW (ref 8.9–10.3)
Chloride: 109 mmol/L (ref 98–111)
Creatinine, Ser: 0.53 mg/dL (ref 0.44–1.00)
GFR, Estimated: >60 mL/min (ref >60)
Glucose, Bld: 103 mg/dL — ABNORMAL HIGH (ref 70–99)
Potassium: 3.2 mmol/L — ABNORMAL LOW (ref 3.5–5.1)
Sodium: 139 mmol/L (ref 135–145)

## 2023-01-12 LAB — MAGNESIUM: Magnesium: 2.1 mg/dL (ref 1.7–2.4)

## 2023-01-12 MED ORDER — HYDROMORPHONE HCL 1 MG/ML IJ SOLN
0.5000 mg | INTRAMUSCULAR | Status: DC | PRN
  Administered 2023-01-13 – 2023-01-14 (×3): 0.5 mg via INTRAVENOUS
  Filled 2023-01-12 (×2): qty 0.5
  Filled 2023-01-12: qty 1
  Filled 2023-01-12: qty 0.5

## 2023-01-12 MED ORDER — ONDANSETRON HCL 4 MG/2ML IJ SOLN
4.0000 mg | Freq: Four times a day (QID) | INTRAMUSCULAR | Status: DC | PRN
  Administered 2023-01-15 – 2023-01-22 (×4): 4 mg via INTRAVENOUS
  Filled 2023-01-12 (×4): qty 2

## 2023-01-12 MED ORDER — KETOROLAC TROMETHAMINE 15 MG/ML IJ SOLN
15.0000 mg | Freq: Once | INTRAMUSCULAR | Status: AC
  Administered 2023-01-12: 15 mg via INTRAVENOUS
  Filled 2023-01-12: qty 1

## 2023-01-12 MED ORDER — ACETAMINOPHEN 650 MG RE SUPP: 650.0000 mg | Freq: Four times a day (QID) | RECTAL | Status: DC | PRN

## 2023-01-12 MED ORDER — KETOROLAC TROMETHAMINE 15 MG/ML IJ SOLN
15.0000 mg | Freq: Four times a day (QID) | INTRAMUSCULAR | Status: AC | PRN
  Administered 2023-01-13 – 2023-01-16 (×3): 15 mg via INTRAVENOUS
  Filled 2023-01-12 (×5): qty 1

## 2023-01-12 MED ORDER — NALOXONE HCL 0.4 MG/ML IJ SOLN: 0.4000 mg | INTRAMUSCULAR | Status: DC | PRN

## 2023-01-12 MED ORDER — ACETAMINOPHEN 325 MG PO TABS
650.0000 mg | ORAL_TABLET | Freq: Four times a day (QID) | ORAL | Status: DC | PRN
  Administered 2023-01-13 – 2023-01-21 (×4): 650 mg via ORAL
  Filled 2023-01-12 (×5): qty 2

## 2023-01-12 MED ORDER — METHOCARBAMOL 500 MG PO TABS
500.0000 mg | ORAL_TABLET | Freq: Once | ORAL | Status: AC
  Administered 2023-01-12: 500 mg via ORAL
  Filled 2023-01-12: qty 1

## 2023-01-12 MED ORDER — MELATONIN 3 MG PO TABS: 3.0000 mg | ORAL_TABLET | Freq: Every evening | ORAL | Status: DC | PRN

## 2023-01-12 MED ORDER — MORPHINE SULFATE (PF) 4 MG/ML IV SOLN
4.0000 mg | Freq: Once | INTRAVENOUS | Status: AC
  Administered 2023-01-12: 4 mg via INTRAVENOUS
  Filled 2023-01-12: qty 1

## 2023-01-12 MED ORDER — FENTANYL CITRATE PF 50 MCG/ML IJ SOSY
50.0000 ug | PREFILLED_SYRINGE | Freq: Once | INTRAMUSCULAR | Status: AC
  Administered 2023-01-12: 50 ug via INTRAVENOUS
  Filled 2023-01-12: qty 1

## 2023-01-12 NOTE — ED Provider Notes (Signed)
[place] EMERGENCY DEPARTMENT AT [facility] Provider Note   CSN: [id number] Arrival date & time: 01/12/23  1633     History  Chief Complaint  Patient presents with   Fall   Hip Pain    BRIGGS LYERLY is a 67 y.o. female.  HPI 67 year old female presenting for fall.  Patient was bowling when she slipped on the lane.  She felt like her left leg went out and she landed on her left hip.  She did not hit her head or lose consciousness.  She has no headache, neck pain, chest pain or back pain, abdominal pain.  Her pain is really in her left hip.  She has difficulty extending because of this.  No weakness or numbness.  No pain in her right leg.     Home Medications Prior to Admission medications   Medication Sig Start Date End Date Taking? Authorizing Provider  celecoxib (CELEBREX) 200 MG capsule Take 1 capsule by mouth daily. 12/25/22  Yes [provider]  cyclobenzaprine (FLEXERIL) 10 MG tablet Take 1 tablet (10 mg total) by mouth 3 (three) times daily as needed for muscle spasms. 05/13/18  Yes Hilts, Casimiro Needle, MD  estradiol (VIVELLE-DOT) 0.025 MG/24HR Place 1 patch onto the skin 2 (two) times a week. WED and SAT   Yes [provider]  levothyroxine (SYNTHROID, LEVOTHROID) 50 MCG tablet TAKE 1 TABLET BY MOUTH ON EMPTY STOMACH IN MORNING EVERY OTHER DAY ALTERNATING WITH 11/15/17  Yes [provider]  levothyroxine (SYNTHROID, LEVOTHROID) 75 MCG tablet TAKE 1 TABLET BY MOUTH ON AN EMPTY STOMACH IN MORNING EVERY OTHER DAY ALTERNATING WITH 11/15/17  Yes [provider]  MYRBETRIQ 50 MG TB24 tablet Take 50 mg by mouth daily.   Yes [provider]  naproxen (NAPROSYN) 500 MG tablet Take 1 tablet (500 mg total) by mouth 2 (two) times daily with a meal. 02/08/18  Yes Tarry Kos, MD  pantoprazole (PROTONIX) 20 MG tablet Take 20 mg by mouth daily.   Yes [provider]  baclofen (LIORESAL) 10 MG tablet Take 0.5-1 tablets  (5-10 mg total) by mouth 3 (three) times daily as needed for muscle spasms. Patient not taking: Reported on 01/12/2023 07/19/18   Hilts, Casimiro Needle, MD  diclofenac (VOLTAREN) 75 MG EC tablet Take 1 tablet (75 mg total) by mouth 2 (two) times daily. Patient not taking: Reported on 01/12/2023 01/26/20   Cristie Hem, PA-C  estradiol (ESTRACE) 0.5 MG tablet Take 0.5 mg by mouth daily. Patient not taking: Reported on 01/12/2023    [provider]  fluocinonide (LIDEX) 0.05 % cream Apply topically as directed.   Patient not taking: Reported on 01/12/2023    [provider]  methocarbamol (ROBAXIN) 500 MG tablet Take 1 tablet (500 mg total) by mouth 2 (two) times daily as needed. Patient not taking: Reported on 01/12/2023 01/16/20   Cristie Hem, PA-C  nabumetone (RELAFEN) 750 MG tablet Take 1 tablet (750 mg total) by mouth 2 (two) times daily as needed. Patient not taking: Reported on 01/12/2023 07/19/18   Hilts, Casimiro Needle, MD  pantoprazole (PROTONIX) 40 MG tablet Take 40 mg by mouth daily.    [provider]      Allergies    Diphenhydramine hcl    Review of Systems   Review of Systems Review of systems completed and notable as per HPI.  ROS otherwise negative.   Physical Exam Updated Vital Signs BP (!) 150/75 (BP Location: Left  Arm)   Pulse 75   Temp 98.1 F (36.7 C) (Oral)   Resp 18   Ht 5\' 8"  (1.727 m)   Wt 76.2 kg   SpO2 99%   BMI 25.54 kg/m  Physical Exam Vitals and nursing note reviewed.  Constitutional:      General: She is not in acute distress.    Appearance: She is well-developed.  HENT:     Head: Normocephalic and atraumatic.     Nose: Nose normal.     Mouth/Throat:     Mouth: Mucous membranes are moist.     Pharynx: Oropharynx is clear.  Eyes:     Extraocular Movements: Extraocular movements intact.     Conjunctiva/sclera: Conjunctivae normal.     Pupils: Pupils are equal, round, and reactive to light.  Cardiovascular:     Rate  and Rhythm: Normal rate and regular rhythm.     Pulses: Normal pulses.     Heart sounds: Normal heart sounds. No murmur heard. Pulmonary:     Effort: Pulmonary effort is normal. No respiratory distress.     Breath sounds: Normal breath sounds.  Abdominal:     Palpations: Abdomen is soft.     Tenderness: There is no abdominal tenderness. There is no guarding or rebound.  Musculoskeletal:        General: No swelling.     Cervical back: Normal range of motion and neck supple. No rigidity or tenderness.     Right lower leg: No edema.     Left lower leg: No edema.     Comments: Mild tenderness to the left lateral hip.  No skin changes.  She has limited range of motion of the left hip.  She has good flexion but is not able to fully extend.  She has no tenderness along the mid to distal femur, knee, leg, foot.  2+ DP and PT pulse per normal strength and sensation distally.  Palpation of the chest, back, abdomen without tenderness.  Skin:    General: Skin is warm and dry.     Capillary Refill: Capillary refill takes less than 2 seconds.  Neurological:     General: No focal deficit present.     Mental Status: She is alert and oriented to person, place, and time. Mental status is at baseline.  Psychiatric:        Mood and Affect: Mood normal.     ED Results / Procedures / Treatments   Labs (all labs ordered are listed, but only abnormal results are displayed) Labs Reviewed  CBC WITH DIFFERENTIAL/PLATELET - Abnormal; Notable for the following components:      Result Value   RBC 3.72 (*)    Hemoglobin 11.6 (*)    HCT 34.4 (*)    All other components within normal limits  BASIC METABOLIC PANEL - Abnormal; Notable for the following components:   Potassium 3.2 (*)    Glucose, Bld 103 (*)    Calcium 8.0 (*)    All other components within normal limits  MAGNESIUM  CBC WITH DIFFERENTIAL/PLATELET  COMPREHENSIVE METABOLIC PANEL  MAGNESIUM    EKG None  Radiology DG Femur 1V  Left  Result Date: 01/12/2023 CLINICAL DATA:  Left thigh pain EXAM: LEFT FEMUR 1 VIEW COMPARISON:  None Available. FINDINGS: There is no evidence of fracture or other focal bone lesions. Soft tissues are unremarkable. IMPRESSION: Negative. Electronically Signed   By: Jasmine Pang M.D.   On: 01/12/2023 22:00   CT PELVIS WO CONTRAST  Result Date: 01/12/2023 CLINICAL DATA:  Trauma. EXAM: CT PELVIS WITHOUT CONTRAST TECHNIQUE: Multidetector CT imaging of the pelvis was performed following the standard protocol without intravenous contrast. RADIATION DOSE REDUCTION: This exam was performed according to the departmental dose-optimization program which includes automated exposure control, adjustment of the mA and/or kV according to patient size and/or use of iterative reconstruction technique. COMPARISON:  None Available. FINDINGS: Urinary Tract:  No abnormality visualized. Bowel: Unremarkable visualized pelvic bowel loops. Normal appendix. 41 Vascular/Lymphatic: No pathologically enlarged lymph nodes. Prominent superficial vessels in the right inguinal region consistent with collateralization. Reproductive: No mass or other significant abnormality. Status post hysterectomy. Other:  None. Musculoskeletal: No evidence of fracture. No suspicious bone lesions identified. Facet joint degenerative changes at the lumbosacral junction. IMPRESSION: No acute pelvic pathology identified. Electronically Signed   By: Layla Maw M.D.   On: 01/12/2023 19:33   DG Hip Unilat With Pelvis 2-3 Views Left  Result Date: 01/12/2023 CLINICAL DATA:  Fall, pain. EXAM: DG HIP (WITH OR WITHOUT PELVIS) 3V LEFT COMPARISON:  05/13/2018. FINDINGS: There is no evidence of hip fracture or dislocation. There is no evidence of arthropathy or other focal bone abnormality. There are lumbosacral degenerative changes. IMPRESSION: Lumbosacral degenerative changes.  No acute osseous abnormalities. Electronically Signed   By: Layla Maw  M.D.   On: 01/12/2023 19:28    Procedures Procedures    Medications Ordered in ED Medications  acetaminophen (TYLENOL) tablet 650 mg (has no administration in time range)    Or  acetaminophen (TYLENOL) suppository 650 mg (has no administration in time range)  melatonin tablet 3 mg (has no administration in time range)  ondansetron (ZOFRAN) injection 4 mg (has no administration in time range)  naloxone (NARCAN) injection 0.4 mg (has no administration in time range)  HYDROmorphone (DILAUDID) injection 0.5 mg (has no administration in time range)  ketorolac (TORADOL) 15 MG/ML injection 15 mg (has no administration in time range)  fentaNYL (SUBLIMAZE) injection 50 mcg (50 mcg Intravenous Given 01/12/23 1724)  morphine (PF) 4 MG/ML injection 4 mg (4 mg Intravenous Given 01/12/23 1956)  ketorolac (TORADOL) 15 MG/ML injection 15 mg (15 mg Intravenous Given 01/12/23 1956)  methocarbamol (ROBAXIN) tablet 500 mg (500 mg Oral Given 01/12/23 2103)    ED Course/ Medical Decision Making/ A&P                                 Medical Decision Making Amount and/or Complexity of Data Reviewed Labs: ordered. Radiology: ordered.  Risk Prescription drug management. Decision regarding hospitalization.   Medical Decision Making:   EMMALYNE OLIVERSON is a 67 y.o. female who presented to the ED today with mechanical fall and left hip pain.  On exam she is awake and alert.  She did not hit her head and has normal neurologic exam.  She is not anticoagulated.  Low suspicion for intracranial or cervical spine injury.  Her pain is really only in her left hip.  She is some limited extension here.  Concern for possible fracture or dislocation.  She is neurovascularly intact.  Will obtain plain film and try to control her pain.   Patient placed on continuous vitals and telemetry monitoring while in ED which was reviewed periodically.  Reviewed and confirmed nursing documentation for past medical history, family  history, social history.  Reassessment and Plan:   Plain films reviewed without fracture or dislocation.  She is still in significant  pain mostly over her left ischium.  I obtained a CT scan and reviewed this, she is no signs of occult fracture here.  I did obtain an x-ray of her femur as well given persistent pain and does not show any acute abnormality.  Her tenderness is really over the musculature of her posterior thigh and is worse with hip extension.  I think she has a muscular strain here.  Unfortunately despite multiple times I was not able to control her pain and she was unable to ambulate safely.  Given this discussed with hospitalist and admitted.   Patient's presentation is most consistent with acute complicated illness / injury requiring diagnostic workup.           Final Clinical Impression(s) / ED Diagnoses Final diagnoses:  Fall, initial encounter    Rx / DC Orders ED Discharge Orders     None         Laurence Spates, MD 01/12/23 2336

## 2023-01-12 NOTE — H&P (Incomplete)
History and Physical      Christina Howell:[id number] DOB: 1955-11-17 DOA: 01/12/2023; DOS: 01/12/2023  PCP: [name], MD *** Patient coming from: home ***  I have personally briefly reviewed patient's old medical records in [facility]  Chief Complaint: ***  HPI: Christina Howell is a 67 y.o. female with medical history significant for *** who is admitted to [facility] on 01/12/2023 with *** after presenting from home*** to [facility] ED complaining of ***.   ***        ***  ED Course:  Vital signs in the ED were notable for the following: ***  Labs were notable for the following: ***  Per my interpretation, EKG in ED demonstrated the following:  ***  Imaging in the ED, per corresponding formal radiology read, was notable for the following: ***  While in the ED, the following were administered: ***  Subsequently, the patient was admitted  ***  ***red   Review of Systems: As per HPI otherwise 10 point review of systems negative.   Past Medical History:  Diagnosis Date   Eczema    dermatologist--- Dr. Rosine Door spur 2009   by x-ray   Herniated disc    L4-L5 and T11-T12----has seen neurosurgeon Dr. Venetia Maxon   Plantar fasciitis    Varicose vein     Past Surgical History:  Procedure Laterality Date   TOTAL ABDOMINAL HYSTERECTOMY     VARICOSE VEIN SURGERY     WISDOM TOOTH EXTRACTION      Social History:  reports that she has never smoked. She has never used smokeless tobacco. She reports current alcohol use. She reports that she does not use drugs.   Allergies  Allergen Reactions   Diphenhydramine Hcl Other (See Comments)    hyper    Family History  Problem Relation Age of Onset   Emphysema Paternal Grandmother    Rheum arthritis Father     Family history reviewed and not pertinent ***   Prior to Admission medications   Medication Sig Start Date End Date Taking? Authorizing Provider  celecoxib (CELEBREX) 200 MG capsule Take 1  capsule by mouth daily. 12/25/22  Yes [provider]  baclofen (LIORESAL) 10 MG tablet Take 0.5-1 tablets (5-10 mg total) by mouth 3 (three) times daily as needed for muscle spasms. 07/19/18   Hilts, Casimiro Needle, MD  cyclobenzaprine (FLEXERIL) 10 MG tablet Take 1 tablet (10 mg total) by mouth 3 (three) times daily as needed for muscle spasms. 05/13/18   Hilts, Casimiro Needle, MD  diclofenac (VOLTAREN) 75 MG EC tablet Take 1 tablet (75 mg total) by mouth 2 (two) times daily. 01/26/20   Cristie Hem, PA-C  estradiol (ESTRACE) 0.5 MG tablet Take 0.5 mg by mouth daily.    [provider]  estradiol (VIVELLE-DOT) 0.025 MG/24HR Place 1 patch onto the skin 2 (two) times a week.    [provider]  fluocinonide (LIDEX) 0.05 % cream Apply topically as directed.      [provider]  levothyroxine (SYNTHROID, LEVOTHROID) 50 MCG tablet TAKE 1 TABLET BY MOUTH ON EMPTY STOMACH IN MORNING EVERY OTHER DAY ALTERNATING WITH 11/15/17   [provider]  levothyroxine (SYNTHROID, LEVOTHROID) 75 MCG tablet TAKE 1 TABLET BY MOUTH ON AN EMPTY STOMACH IN MORNING EVERY OTHER DAY ALTERNATING WITH 11/15/17   [provider]  methocarbamol (ROBAXIN) 500 MG tablet Take 1 tablet (500 mg total) by mouth 2 (two) times daily as  needed. 01/16/20   Cristie Hem, PA-C  MYRBETRIQ 50 MG TB24 tablet Take 50 mg by mouth daily.    [provider]  nabumetone (RELAFEN) 750 MG tablet Take 1 tablet (750 mg total) by mouth 2 (two) times daily as needed. 07/19/18   Hilts, Casimiro Needle, MD  naproxen (NAPROSYN) 500 MG tablet Take 1 tablet (500 mg total) by mouth 2 (two) times daily with a meal. 02/08/18   Tarry Kos, MD  pantoprazole (PROTONIX) 20 MG tablet Take 20 mg by mouth daily.    [provider]  pantoprazole (PROTONIX) 40 MG tablet Take 40 mg by mouth daily.    [provider]     Objective    Physical Exam: Vitals:   01/12/23 1654 01/12/23 1658  01/12/23 1703 01/12/23 2005  BP:   (!) 141/85 (!) 150/75  Pulse:   73 75  Resp:   20 18  Temp:   97.8 F (36.6 C) 98.1 F (36.7 C)  TempSrc:   Oral Oral  SpO2: 99%  100% 99%  Weight:  76.2 kg    Height:  5\' 8"  (1.727 m)      General: appears to be stated age; alert, oriented Skin: warm, dry, no rash Head:  AT/Ronda Mouth:  Oral mucosa membranes appear moist, normal dentition Neck: supple; trachea midline Heart:  RRR; did not appreciate any M/R/G Lungs: CTAB, did not appreciate any wheezes, rales, or rhonchi Abdomen: + BS; soft, ND, NT Vascular: 2+ pedal pulses b/l; 2+ radial pulses b/l Extremities: no peripheral edema, no muscle wasting Neuro: strength and sensation intact in upper and lower extremities b/l    *** Neuro: 5/5 strength of the proximal and distal flexors and extensors of the upper and lower extremities bilaterally; sensation intact in upper and lower extremities b/l; cranial nerves II through XII grossly intact; no pronator drift; no evidence suggestive of slurred speech, dysarthria, or facial droop; Normal muscle tone. No tremors. *** Neuro: In the setting of the patient's current mental status and associated inability to follow instructions, unable to perform full neurologic exam at this time.  As such, assessment of strength, sensation, and cranial nerves is limited at this time. Patient noted to spontaneously move all 4 extremities. No tremors.  ***    Labs on Admission: I have personally reviewed following labs and imaging studies  CBC: Recent Labs  Lab 01/12/23 2110  WBC 7.0  NEUTROABS 4.5  HGB 11.6*  HCT 34.4*  MCV 92.5  PLT 163   Basic Metabolic Panel: No results for input(s): "NA", "K", "CL", "CO2", "GLUCOSE", "BUN", "CREATININE", "CALCIUM", "MG", "PHOS" in the last 168 hours. GFR: CrCl cannot be calculated (No successful lab value found.). Liver Function Tests: No results for input(s): "AST", "ALT", "ALKPHOS", "BILITOT", "PROT", "ALBUMIN" in  the last 168 hours. No results for input(s): "LIPASE", "AMYLASE" in the last 168 hours. No results for input(s): "AMMONIA" in the last 168 hours. Coagulation Profile: No results for input(s): "INR", "PROTIME" in the last 168 hours. Cardiac Enzymes: No results for input(s): "CKTOTAL", "CKMB", "CKMBINDEX", "TROPONINI" in the last 168 hours. BNP (last 3 results) No results for input(s): "PROBNP" in the last 8760 hours. HbA1C: No results for input(s): "HGBA1C" in the last 72 hours. CBG: No results for input(s): "GLUCAP" in the last 168 hours. Lipid Profile: No results for input(s): "CHOL", "HDL", "LDLCALC", "TRIG", "CHOLHDL", "LDLDIRECT" in the last 72 hours. Thyroid Function Tests: No results for input(s): "TSH", "T4TOTAL", "FREET4", "T3FREE", "THYROIDAB" in the last  72 hours. Anemia Panel: No results for input(s): "VITAMINB12", "FOLATE", "FERRITIN", "TIBC", "IRON", "RETICCTPCT" in the last 72 hours. Urine analysis: No results found for: "COLORURINE", "APPEARANCEUR", "LABSPEC", "PHURINE", "GLUCOSEU", "HGBUR", "BILIRUBINUR", "KETONESUR", "PROTEINUR", "UROBILINOGEN", "NITRITE", "LEUKOCYTESUR"  Radiological Exams on Admission: DG Femur 1V Left  Result Date: 01/12/2023 CLINICAL DATA:  Left thigh pain EXAM: LEFT FEMUR 1 VIEW COMPARISON:  None Available. FINDINGS: There is no evidence of fracture or other focal bone lesions. Soft tissues are unremarkable. IMPRESSION: Negative. Electronically Signed   By: Jasmine Pang M.D.   On: 01/12/2023 22:00   CT PELVIS WO CONTRAST  Result Date: 01/12/2023 CLINICAL DATA:  Trauma. EXAM: CT PELVIS WITHOUT CONTRAST TECHNIQUE: Multidetector CT imaging of the pelvis was performed following the standard protocol without intravenous contrast. RADIATION DOSE REDUCTION: This exam was performed according to the departmental dose-optimization program which includes automated exposure control, adjustment of the mA and/or kV according to patient size and/or use of  iterative reconstruction technique. COMPARISON:  None Available. FINDINGS: Urinary Tract:  No abnormality visualized. Bowel: Unremarkable visualized pelvic bowel loops. Normal appendix. 41 Vascular/Lymphatic: No pathologically enlarged lymph nodes. Prominent superficial vessels in the right inguinal region consistent with collateralization. Reproductive: No mass or other significant abnormality. Status post hysterectomy. Other:  None. Musculoskeletal: No evidence of fracture. No suspicious bone lesions identified. Facet joint degenerative changes at the lumbosacral junction. IMPRESSION: No acute pelvic pathology identified. Electronically Signed   By: Layla Maw M.D.   On: 01/12/2023 19:33   DG Hip Unilat With Pelvis 2-3 Views Left  Result Date: 01/12/2023 CLINICAL DATA:  Fall, pain. EXAM: DG HIP (WITH OR WITHOUT PELVIS) 3V LEFT COMPARISON:  05/13/2018. FINDINGS: There is no evidence of hip fracture or dislocation. There is no evidence of arthropathy or other focal bone abnormality. There are lumbosacral degenerative changes. IMPRESSION: Lumbosacral degenerative changes.  No acute osseous abnormalities. Electronically Signed   By: Layla Maw M.D.   On: 01/12/2023 19:28      Assessment/Plan    Principal Problem:   Acute hip pain, left  ***              ***                ***               ***               ***               ***              ***               ***               ***               ***               ***               ***               ***              ***     DVT prophylaxis: SCD's ***  Code Status: Full code*** Family Communication: none*** Disposition Plan: Per Rounding Team Consults called: none***;  Admission status: ***    I SPENT GREATER THAN 75 *** MINUTES IN CLINICAL  CARE TIME/MEDICAL DECISION-MAKING IN COMPLETING THIS ADMISSION.     Jill Alexanders  B Curvin Hunger DO [facility] From 7PM - 7AM   01/12/2023, 10:12 PM   ***

## 2023-01-12 NOTE — ED Triage Notes (Addendum)
[place] fell in alley. Hyperextended L leg. Can't straighten.  Given 100 mg fent by EMS  AOx4

## 2023-01-13 ENCOUNTER — Observation Stay (HOSPITAL_COMMUNITY): Payer: Medicare Other

## 2023-01-13 ENCOUNTER — Encounter (HOSPITAL_COMMUNITY): Payer: Self-pay | Admitting: Internal Medicine

## 2023-01-13 DIAGNOSIS — R32 Unspecified urinary incontinence: Secondary | ICD-10-CM | POA: Diagnosis present

## 2023-01-13 DIAGNOSIS — E039 Hypothyroidism, unspecified: Secondary | ICD-10-CM | POA: Diagnosis present

## 2023-01-13 DIAGNOSIS — W19XXXA Unspecified fall, initial encounter: Secondary | ICD-10-CM | POA: Insufficient documentation

## 2023-01-13 DIAGNOSIS — K219 Gastro-esophageal reflux disease without esophagitis: Secondary | ICD-10-CM | POA: Diagnosis present

## 2023-01-13 DIAGNOSIS — M79605 Pain in left leg: Secondary | ICD-10-CM | POA: Diagnosis not present

## 2023-01-13 DIAGNOSIS — E876 Hypokalemia: Secondary | ICD-10-CM | POA: Diagnosis present

## 2023-01-13 DIAGNOSIS — S76312A Strain of muscle, fascia and tendon of the posterior muscle group at thigh level, left thigh, initial encounter: Secondary | ICD-10-CM | POA: Diagnosis not present

## 2023-01-13 DIAGNOSIS — M25552 Pain in left hip: Secondary | ICD-10-CM | POA: Diagnosis not present

## 2023-01-13 LAB — CBC WITH DIFFERENTIAL/PLATELET
Abs Immature Granulocytes: 0.02 10*3/uL (ref 0.00–0.07)
Basophils Absolute: 0 10*3/uL (ref 0.0–0.1)
Basophils Relative: 0 %
Eosinophils Absolute: 0.1 10*3/uL (ref 0.0–0.5)
Eosinophils Relative: 1 %
HCT: 38 % (ref 36.0–46.0)
Hemoglobin: 12.7 g/dL (ref 12.0–15.0)
Immature Granulocytes: 0 %
Lymphocytes Relative: 29 %
Lymphs Abs: 1.8 10*3/uL (ref 0.7–4.0)
MCH: 30.5 pg (ref 26.0–34.0)
MCHC: 33.4 g/dL (ref 30.0–36.0)
MCV: 91.3 fL (ref 80.0–100.0)
Monocytes Absolute: 0.5 10*3/uL (ref 0.1–1.0)
Monocytes Relative: 8 %
Neutro Abs: 3.9 10*3/uL (ref 1.7–7.7)
Neutrophils Relative %: 62 %
Platelets: 181 10*3/uL (ref 150–400)
RBC: 4.16 MIL/uL (ref 3.87–5.11)
RDW: 12.1 % (ref 11.5–15.5)
WBC: 6.3 10*3/uL (ref 4.0–10.5)
nRBC: 0 % (ref 0.0–0.2)

## 2023-01-13 LAB — COMPREHENSIVE METABOLIC PANEL
ALT: 20 U/L (ref 0–44)
AST: 20 U/L (ref 15–41)
Albumin: 3.8 g/dL (ref 3.5–5.0)
Alkaline Phosphatase: 44 U/L (ref 38–126)
Anion gap: 7 (ref 5–15)
BUN: 13 mg/dL (ref 8–23)
CO2: 24 mmol/L (ref 22–32)
Calcium: 8.5 mg/dL — ABNORMAL LOW (ref 8.9–10.3)
Chloride: 102 mmol/L (ref 98–111)
Creatinine, Ser: 0.61 mg/dL (ref 0.44–1.00)
GFR, Estimated: >60 mL/min (ref >60)
Glucose, Bld: 101 mg/dL — ABNORMAL HIGH (ref 70–99)
Potassium: 3.6 mmol/L (ref 3.5–5.1)
Sodium: 133 mmol/L — ABNORMAL LOW (ref 135–145)
Total Bilirubin: 1.1 mg/dL (ref <1.2)
Total Protein: 5.9 g/dL — ABNORMAL LOW (ref 6.5–8.1)

## 2023-01-13 LAB — MAGNESIUM: Magnesium: 2.1 mg/dL (ref 1.7–2.4)

## 2023-01-13 MED ORDER — LEVOTHYROXINE SODIUM 50 MCG PO TABS
50.0000 ug | ORAL_TABLET | Freq: Every day | ORAL | Status: DC
  Administered 2023-01-13 – 2023-01-22 (×10): 50 ug via ORAL
  Filled 2023-01-13 (×9): qty 1

## 2023-01-13 MED ORDER — MIRABEGRON ER 25 MG PO TB24
50.0000 mg | ORAL_TABLET | Freq: Every day | ORAL | Status: DC
  Filled 2023-01-13: qty 2

## 2023-01-13 MED ORDER — MIRABEGRON ER 25 MG PO TB24
50.0000 mg | ORAL_TABLET | Freq: Every day | ORAL | Status: DC
  Administered 2023-01-13 – 2023-01-22 (×10): 50 mg via ORAL
  Filled 2023-01-13 (×10): qty 2

## 2023-01-13 MED ORDER — KETOROLAC TROMETHAMINE 15 MG/ML IJ SOLN
15.0000 mg | Freq: Once | INTRAMUSCULAR | Status: AC
  Administered 2023-01-13: 15 mg via INTRAVENOUS
  Filled 2023-01-13: qty 1

## 2023-01-13 MED ORDER — PANTOPRAZOLE SODIUM 20 MG PO TBEC
20.0000 mg | DELAYED_RELEASE_TABLET | Freq: Every day | ORAL | Status: DC
  Administered 2023-01-13 – 2023-01-23 (×11): 20 mg via ORAL
  Filled 2023-01-13 (×12): qty 1

## 2023-01-13 MED ORDER — CYCLOBENZAPRINE HCL 10 MG PO TABS
10.0000 mg | ORAL_TABLET | Freq: Three times a day (TID) | ORAL | Status: DC | PRN
  Administered 2023-01-13 – 2023-01-21 (×17): 10 mg via ORAL
  Filled 2023-01-13 (×19): qty 1

## 2023-01-13 MED ORDER — MIRABEGRON ER 25 MG PO TB24
50.0000 mg | ORAL_TABLET | Freq: Once | ORAL | Status: AC
  Administered 2023-01-13: 50 mg via ORAL
  Filled 2023-01-13: qty 2

## 2023-01-13 MED ORDER — POTASSIUM CHLORIDE CRYS ER 20 MEQ PO TBCR
40.0000 meq | EXTENDED_RELEASE_TABLET | Freq: Once | ORAL | Status: AC
  Administered 2023-01-13: 40 meq via ORAL
  Filled 2023-01-13: qty 2

## 2023-01-13 MED ORDER — HYDROMORPHONE HCL 1 MG/ML IJ SOLN
0.5000 mg | Freq: Once | INTRAMUSCULAR | Status: AC
  Administered 2023-01-13: 0.5 mg via INTRAVENOUS
  Filled 2023-01-13: qty 1

## 2023-01-13 MED ORDER — LIDOCAINE 5 % EX PTCH
1.0000 | MEDICATED_PATCH | CUTANEOUS | Status: DC
  Administered 2023-01-13 – 2023-01-20 (×8): 1 via TRANSDERMAL
  Filled 2023-01-13 (×8): qty 1

## 2023-01-13 NOTE — ED Notes (Signed)
ED TO INPATIENT HANDOFF REPORT  ED Nurse Name and Phone #: Lorriane Shire RN  S Name/Age/Gender Christina Howell 67 y.o. female Room/Bed: WA24/WA24  Code Status   Code Status: Full Code  Home/SNF/Other Home Patient oriented to: self, place, time, and situation Is this baseline? Yes   Triage Complete: Triage complete  Chief Complaint Acute hip pain, left [M25.552]  Triage Note [place] and fell in alley. Hyperextended L leg. Can't straighten.  Given 100 mg fent by EMS  AOx4   Allergies Allergies  Allergen Reactions   Diphenhydramine Hcl Other (See Comments)    hyper    Level of Care/Admitting Diagnosis ED Disposition     ED Disposition  Admit   Condition  --   Comment  Hospital Area: [facility] [[id number]]  Level of Care: Med-Surg [16]  May place patient in observation at [facility] or [facility] if equivalent level of care is available:: No  Covid Evaluation: Asymptomatic - no recent exposure (last 10 days) testing not required  Diagnosis: Acute hip pain, left [[id number]]  Admitting Physician: Angie Fava [[id number]]  Attending Physician: Angie Fava [[id number]]          B Medical/Surgery History Past Medical History:  Diagnosis Date   Acquired hypothyroidism    Eczema    dermatologist--- Dr. Lonni Fix    GERD (gastroesophageal reflux disease)    Heel spur 2009   by x-ray   Herniated disc    L4-L5 and T11-T12----has seen neurosurgeon Dr. Venetia Maxon   Plantar fasciitis    Urinary incontinence    Varicose vein    Past Surgical History:  Procedure Laterality Date   TOTAL ABDOMINAL HYSTERECTOMY     VARICOSE VEIN SURGERY     WISDOM TOOTH EXTRACTION       A IV Location/Drains/Wounds Patient Lines/Drains/Airways Status     Active Line/Drains/Airways     Name Placement date Placement time Site Days   Peripheral IV 20 G Left;Posterior Hand --  --  Hand  --            Intake/Output Last 24  hours No intake or output data in the 24 hours ending 01/13/23 0329  Labs/Imaging Results for orders placed or performed during the hospital encounter of 01/12/23 (from the past 48 hour(s))  CBC with Differential     Status: Abnormal   Collection Time: 01/12/23  9:10 PM  Result Value Ref Range   WBC 7.0 4.0 - 10.5 K/uL   RBC 3.72 (L) 3.87 - 5.11 MIL/uL   Hemoglobin 11.6 (L) 12.0 - 15.0 g/dL   HCT 28.4 (L) 13.2 - 44.0 %   MCV 92.5 80.0 - 100.0 fL   MCH 31.2 26.0 - 34.0 pg   MCHC 33.7 30.0 - 36.0 g/dL   RDW 10.2 72.5 - 36.6 %   Platelets 163 150 - 400 K/uL   nRBC 0.0 0.0 - 0.2 %   Neutrophils Relative % 64 %   Neutro Abs 4.5 1.7 - 7.7 K/uL   Lymphocytes Relative 28 %   Lymphs Abs 2.0 0.7 - 4.0 K/uL   Monocytes Relative 7 %   Monocytes Absolute 0.5 0.1 - 1.0 K/uL   Eosinophils Relative 1 %   Eosinophils Absolute 0.1 0.0 - 0.5 K/uL   Basophils Relative 0 %   Basophils Absolute 0.0 0.0 - 0.1 K/uL   Immature Granulocytes 0 %   Abs Immature Granulocytes 0.02 0.00 - 0.07 K/uL  Comment: Performed at [facility], [address], [place], [place] [address]  Basic metabolic panel     Status: Abnormal   Collection Time: 01/12/23  9:10 PM  Result Value Ref Range   Sodium 139 135 - 145 mmol/L   Potassium 3.2 (L) 3.5 - 5.1 mmol/L   Chloride 109 98 - 111 mmol/L   CO2 23 22 - 32 mmol/L   Glucose, Bld 103 (H) 70 - 99 mg/dL    Comment: Glucose reference range applies only to samples taken after fasting for at least 8 hours.   BUN 14 8 - 23 mg/dL   Creatinine, Ser 6.04 0.44 - 1.00 mg/dL   Calcium 8.0 (L) 8.9 - 10.3 mg/dL   GFR, Estimated >54 >09 mL/min    Comment: (NOTE) Calculated using the CKD-EPI Creatinine Equation (2021)    Anion gap 7 5 - 15    Comment: Performed at [facility], [address], [place], [place] [address]  Magnesium     Status: None   Collection Time: 01/12/23  9:10 PM  Result Value Ref Range   Magnesium 2.1 1.7 - 2.4  mg/dL    Comment: Performed at [facility], [address], [place], [place] [address]   DG Femur 1V Left  Result Date: 01/12/2023 CLINICAL DATA:  Left thigh pain EXAM: LEFT FEMUR 1 VIEW COMPARISON:  None Available. FINDINGS: There is no evidence of fracture or other focal bone lesions. Soft tissues are unremarkable. IMPRESSION: Negative. Electronically Signed   By: Jasmine Pang M.D.   On: 01/12/2023 22:00   CT PELVIS WO CONTRAST  Result Date: 01/12/2023 CLINICAL DATA:  Trauma. EXAM: CT PELVIS WITHOUT CONTRAST TECHNIQUE: Multidetector CT imaging of the pelvis was performed following the standard protocol without intravenous contrast. RADIATION DOSE REDUCTION: This exam was performed according to the departmental dose-optimization program which includes automated exposure control, adjustment of the mA and/or kV according to patient size and/or use of iterative reconstruction technique. COMPARISON:  None Available. FINDINGS: Urinary Tract:  No abnormality visualized. Bowel: Unremarkable visualized pelvic bowel loops. Normal appendix. 41 Vascular/Lymphatic: No pathologically enlarged lymph nodes. Prominent superficial vessels in the right inguinal region consistent with collateralization. Reproductive: No mass or other significant abnormality. Status post hysterectomy. Other:  None. Musculoskeletal: No evidence of fracture. No suspicious bone lesions identified. Facet joint degenerative changes at the lumbosacral junction. IMPRESSION: No acute pelvic pathology identified. Electronically Signed   By: Layla Maw M.D.   On: 01/12/2023 19:33   DG Hip Unilat With Pelvis 2-3 Views Left  Result Date: 01/12/2023 CLINICAL DATA:  Fall, pain. EXAM: DG HIP (WITH OR WITHOUT PELVIS) 3V LEFT COMPARISON:  05/13/2018. FINDINGS: There is no evidence of hip fracture or dislocation. There is no evidence of arthropathy or other focal bone abnormality. There are lumbosacral degenerative changes.  IMPRESSION: Lumbosacral degenerative changes.  No acute osseous abnormalities. Electronically Signed   By: Layla Maw M.D.   On: 01/12/2023 19:28    Pending Labs Unresulted Labs (From admission, onward)     Start     Ordered   01/13/23 0500  CBC with Differential/Platelet  Tomorrow morning,   R        01/12/23 2209   01/13/23 0500  Comprehensive metabolic panel  Tomorrow morning,   R        01/12/23 2209   01/13/23 0500  Magnesium  Tomorrow morning,   R        01/12/23 2209  Vitals/Pain Today's Vitals   01/12/23 2005 01/13/23 0147 01/13/23 0208 01/13/23 0211  BP: (!) 150/75  (!) 174/84 (!) 147/84  Pulse: 75  80 74  Resp: 18  17   Temp: 98.1 F (36.7 C)     TempSrc: Oral     SpO2: 99%  100% 100%  Weight:      Height:      PainSc:  Asleep      Isolation Precautions No active isolations  Medications Medications  acetaminophen (TYLENOL) tablet 650 mg (has no administration in time range)    Or  acetaminophen (TYLENOL) suppository 650 mg (has no administration in time range)  melatonin tablet 3 mg (has no administration in time range)  ondansetron (ZOFRAN) injection 4 mg (has no administration in time range)  naloxone (NARCAN) injection 0.4 mg (has no administration in time range)  HYDROmorphone (DILAUDID) injection 0.5 mg (has no administration in time range)  ketorolac (TORADOL) 15 MG/ML injection 15 mg (has no administration in time range)  cyclobenzaprine (FLEXERIL) tablet 10 mg (has no administration in time range)  levothyroxine (SYNTHROID) tablet 50 mcg (has no administration in time range)  mirabegron ER (MYRBETRIQ) tablet 50 mg (has no administration in time range)  pantoprazole (PROTONIX) EC tablet 20 mg (has no administration in time range)  lidocaine (LIDODERM) 5 % 1 patch (1 patch Transdermal Patch Applied 01/13/23 0053)  fentaNYL (SUBLIMAZE) injection 50 mcg (50 mcg Intravenous Given 01/12/23 1724)  morphine (PF) 4 MG/ML injection 4 mg (4  mg Intravenous Given 01/12/23 1956)  ketorolac (TORADOL) 15 MG/ML injection 15 mg (15 mg Intravenous Given 01/12/23 1956)  methocarbamol (ROBAXIN) tablet 500 mg (500 mg Oral Given 01/12/23 2103)  potassium chloride SA (KLOR-CON M) CR tablet 40 mEq (40 mEq Oral Given 01/13/23 0052)    Mobility non-ambulatory     Focused Assessments Cardiac Assessment Handoff:    No results found for: "CKTOTAL", "CKMB", "CKMBINDEX", "TROPONINI" No results found for: "DDIMER" Does the Patient currently have chest pain? No   , Neuro Assessment Handoff:  Swallow screen pass? Yes          Neuro Assessment:   Neuro Checks:      Has TPA been given? No If patient is a Neuro Trauma and patient is going to OR before floor call report to 4N Charge nurse: [phone] or [phone]   R Recommendations: See Admitting Provider Note  Report given to:   Additional Notes: hello this pt is gonna be admitted because of fall, alert oriented, room air, non ambulatory for now because of pain when she tried to get up. All scan went back negative.

## 2023-01-13 NOTE — Evaluation (Signed)
Physical Therapy Evaluation Patient Details Name: Christina Howell MRN: [id number] DOB: 25-Dec-1955 Today's Date: 01/13/2023  History of Present Illness  67 yo female slipped and fell while bowling with family. LLE went into extreme hip flexion with knee extension and landed on Left hip. x rays are negative however pt cannot straighten L LE withotu sever pain in posterior L upper thigh ( hamstring) area.  Clinical Impression  Pt fell and has injury to her posterior L thigh area. Neg xrays, however pain is so sever with any hip flexion and knee extension assuming some sort of muscle injury. Pt was unable to mobilize tot eh edge of the bed today due to limiting factors of the pain in posterior L thigh ( high hamstring area, sharp stabbing ) when knee tried to extend any less than 70 degrees. Repositioned on her right side in bed, pillow between her legs and ice applied to L posterior hamstring area.  We could possible try a slight compression warp with ace bandage to see if pain could be minimized,. She also will have to avoid any hip flexion when she is trying to do any knee extension to eliminate tension on hamstring.   Will continue to follow to see if we can get her mobilizing more to eventually DC home with partner to assist. Will have to negotiate 2 steps and tolerate riding in a car.      If plan is discharge home, recommend the following: A lot of help with walking and/or transfers;A lot of help with bathing/dressing/bathroom;Assistance with cooking/housework;Help with stairs or ramp for entrance   Can travel by private vehicle        Equipment Recommendations Rolling walker (2 wheels)  Recommendations for Other Services       Functional Status Assessment Patient has had a recent decline in their functional status and demonstrates the ability to make significant improvements in function in a reasonable and predictable amount of time.     Precautions / Restrictions        Mobility   Bed Mobility Overal bed mobility: Needs Assistance Bed Mobility: Supine to Sit     Supine to sit: Mod assist     General bed mobility comments: really unable to perform due to pain in posterior hamstring. Would be able to mobilize with her strength but fully limited due to pain in posteriod glut and hamstring on L LE    Transfers                        Ambulation/Gait                  Stairs            Wheelchair Mobility     Tilt Bed    Modified Rankin (Stroke Patients Only)       Balance                                             Pertinent Vitals/Pain Pain Assessment Pain Assessment: 0-10 Pain Score: 10-Worst pain ever Pain Location: when she moved her LLE . however if in supine with LLE flexed and resting over pillow , it was " sore" 2/10 , or right sidelying with pillow between her legs and resting in hip flexion and knee flexion. However with extension of the knee 10/10 pain in posterior  hamstring severe! Pain Descriptors / Indicators: Sharp, Shooting, Stabbing, Throbbing Pain Intervention(s): Limited activity within patient's tolerance, Ice applied, Repositioned    Home Living Family/patient expects to be discharged to:: Private residence Living Arrangements: Spouse/significant other Available Help at Discharge: Family Type of Home: House Home Access: Stairs to enter   Secretary/administrator of Steps: 2   Home Layout: Two level;Bed/bath upstairs (would have to sleep in recliner or couch until can make it upstairs) Home Equipment: Crutches      Prior Function Prior Level of Function : Independent/Modified Independent                     Extremity/Trunk Assessment        Lower Extremity Assessment Lower Extremity Assessment: LLE deficits/detail LLE Deficits / Details: unable to assess due to pain Limited and unable to allow PROM to knee extension due to sever pain in posterior hamstring. No brsuing  noted yet. LLE: Unable to fully assess due to pain       Communication   Communication Communication: No apparent difficulties  Cognition Arousal: Alert Behavior During Therapy: WFL for tasks assessed/performed Overall Cognitive Status: Within Functional Limits for tasks assessed                                          General Comments      Exercises     Assessment/Plan    PT Assessment Patient needs continued PT services  PT Problem List Decreased range of motion;Decreased activity tolerance;Decreased mobility       PT Treatment Interventions DME instruction;Gait training;Stair training;Functional mobility training;Therapeutic activities;Therapeutic exercise;Patient/family education    PT Goals (Current goals can be found in the Care Plan section)  Acute Rehab PT Goals Patient Stated Goal: I want this to be better to get back to my independence self PT Goal Formulation: With patient Time For Goal Achievement: 01/27/23 Potential to Achieve Goals: Good    Frequency Min 1X/week     Co-evaluation               AM-PAC PT "6 Clicks" Mobility  Outcome Measure Help needed turning from your back to your side while in a flat bed without using bedrails?: A Little Help needed moving from lying on your back to sitting on the side of a flat bed without using bedrails?: A Little Help needed moving to and from a bed to a chair (including a wheelchair)?: A Lot Help needed standing up from a chair using your arms (e.g., wheelchair or bedside chair)?: A Lot Help needed to walk in hospital room?: A Lot Help needed climbing 3-5 steps with a railing? : A Lot 6 Click Score: 14    End of Session Equipment Utilized During Treatment: Gait belt (gait belt in room but did not have to use this session) Activity Tolerance: Patient limited by pain Patient left: in bed;with call bell/phone within reach Nurse Communication: Mobility status PT Visit Diagnosis:  Difficulty in walking, not elsewhere classified (R26.2);Pain Pain - Right/Left: Left Pain - part of body: Leg    Time: 1478-2956 PT Time Calculation (min) (ACUTE ONLY): 34 min   Charges:   PT Evaluation $PT Eval Low Complexity: 1 Low PT Treatments $Therapeutic Activity: 8-22 mins PT General Charges $$ ACUTE PT VISIT: 1 Visit         Toriano Aikey, PT, MPT Acute Rehabilitation Services Office: [phone]  If a weekend: secure chat groups: [facility] PT, [facility] OT, [facility] SLP 01/13/2023   Tyashia Morrisette, Clois Dupes 01/13/2023, 1:12 PM

## 2023-01-13 NOTE — Progress Notes (Addendum)
Progress Note   Patient: Christina Howell:[id number] DOB: March 07, 1955 DOA: 01/12/2023     0 DOS: the patient was seen and examined on 01/13/2023    Subjective:  Patient seen and examined at bedside this morning Still complaining of left thigh pain  According to physical therapist she was having pain involving the hamstring area MRI have been ordered to rule out hamstring tendon tear.  Brief hospital course: Christina Howell is a 67 y.o. female with medical history significant for acquired hypothyroidism, GERD, who is admitted to [facility] on 01/12/2023 with acute left hip pain after ground-level mechanical fall as outpatient after presenting from home to [facility] ED complaining of acute left hip pain.      Assessment and Plan:  Acute left hip pain:  Acute onset of left hip pain as a consequence of ground-level mechanical fall  X-ray of the femur as well as CAT scan did not show any acute fracture Patient worked with PT OT today and is felt to have tenderness around the hamstring tendon area MRI have been requested we will follow-up on results If findings of tendon tear we will consult orthopedics Continue as needed pain medication Continue fall precaution  Hypokalemia:  Continue repletion and monitoring  Hypothyroidism Continue Synthroid  Urinary incontinence:  continue Myrbetriq.    GERD:  Protonix   DVT prophylaxis: SCD's   Code Status: Full code Family Communication: none Disposition Plan: Pending MRI and PT OT clearance Consults called: none;  Admission status: obs   Physical Exam: General: appears to be stated age; alert, oriented Skin: warm, dry, no rash Head:  AT/[place] Mouth:  Oral mucosa membranes appear moist, normal dentition Neck: supple; trachea midline Heart:  RRR; did not appreciate any M/R/G Lungs: CTAB, did not appreciate any wheezes, rales, or rhonchi Abdomen: + BS; soft, ND, NT Vascular: 2+ pedal pulses b/l; 2+ radial pulses b/l Extremities: no  peripheral edema, no muscle wasting Neuro: sensation intact in upper and lower extremities b/l;     Vitals:   01/13/23 0332 01/13/23 0421 01/13/23 0809 01/13/23 1207  BP:  (!) 150/91 (!) 146/73 120/61  Pulse:  79 71 71  Resp:  16 17 18   Temp: 98.2 F (36.8 C) 97.7 F (36.5 C) 98.4 F (36.9 C) 97.8 F (36.6 C)  TempSrc: Oral Oral Oral Oral  SpO2:  100% 100% 100%  Weight:  80.1 kg    Height:        Data Reviewed:    Latest Ref Rng & Units 01/13/2023    5:09 AM 01/12/2023    9:10 PM  BMP  Glucose 70 - 99 mg/dL 811  914   BUN 8 - 23 mg/dL 13  14   Creatinine 7.82 - 1.00 mg/dL 9.56  2.13   Sodium 086 - 145 mmol/L 133  139   Potassium 3.5 - 5.1 mmol/L 3.6  3.2   Chloride 98 - 111 mmol/L 102  109   CO2 22 - 32 mmol/L 24  23   Calcium 8.9 - 10.3 mg/dL 8.5  8.0        Latest Ref Rng & Units 01/13/2023    5:09 AM 01/12/2023    9:10 PM  CBC  WBC 4.0 - 10.5 K/uL 6.3  7.0   Hemoglobin 12.0 - 15.0 g/dL 57.8  46.9   Hematocrit 36.0 - 46.0 % 38.0  34.4   Platelets 150 - 400 K/uL 181  163       Family Communication:  Discussed with patient's husband at bedside  Time spent: 58 minutes  Author: Loyce Dys, MD 01/13/2023 1:14 PM  For on call review [email or web address].    Addendum: at 9:10pm   PT/OT patient and she complained of hamstring area pain. MRI showed complete avulsion of left hamstring tendon. I spoke with Dr. [name] orthopedic on call who recommended no operative intervention but for patient to follow up with him in 1-2 weeks as outpatient and to bear weight as tolerated using clutches.

## 2023-01-14 DIAGNOSIS — Z825 Family history of asthma and other chronic lower respiratory diseases: Secondary | ICD-10-CM | POA: Diagnosis not present

## 2023-01-14 DIAGNOSIS — Y9354 Activity, bowling: Secondary | ICD-10-CM | POA: Diagnosis not present

## 2023-01-14 DIAGNOSIS — Z791 Long term (current) use of non-steroidal anti-inflammatories (NSAID): Secondary | ICD-10-CM | POA: Diagnosis not present

## 2023-01-14 DIAGNOSIS — Z90710 Acquired absence of both cervix and uterus: Secondary | ICD-10-CM | POA: Diagnosis not present

## 2023-01-14 DIAGNOSIS — Y9239 Other specified sports and athletic area as the place of occurrence of the external cause: Secondary | ICD-10-CM | POA: Diagnosis not present

## 2023-01-14 DIAGNOSIS — Z888 Allergy status to other drugs, medicaments and biological substances status: Secondary | ICD-10-CM | POA: Diagnosis not present

## 2023-01-14 DIAGNOSIS — S76399A Other specified injury of muscle, fascia and tendon of the posterior muscle group at thigh level, unspecified thigh, initial encounter: Secondary | ICD-10-CM | POA: Diagnosis present

## 2023-01-14 DIAGNOSIS — W010XXA Fall on same level from slipping, tripping and stumbling without subsequent striking against object, initial encounter: Secondary | ICD-10-CM

## 2023-01-14 DIAGNOSIS — K219 Gastro-esophageal reflux disease without esophagitis: Secondary | ICD-10-CM | POA: Diagnosis present

## 2023-01-14 DIAGNOSIS — Z9889 Other specified postprocedural states: Secondary | ICD-10-CM | POA: Diagnosis not present

## 2023-01-14 DIAGNOSIS — S76392A Other specified injury of muscle, fascia and tendon of the posterior muscle group at thigh level, left thigh, initial encounter: Secondary | ICD-10-CM | POA: Diagnosis present

## 2023-01-14 DIAGNOSIS — M5126 Other intervertebral disc displacement, lumbar region: Secondary | ICD-10-CM | POA: Diagnosis present

## 2023-01-14 DIAGNOSIS — T148XXA Other injury of unspecified body region, initial encounter: Secondary | ICD-10-CM | POA: Diagnosis not present

## 2023-01-14 DIAGNOSIS — E876 Hypokalemia: Secondary | ICD-10-CM | POA: Diagnosis present

## 2023-01-14 DIAGNOSIS — M5124 Other intervertebral disc displacement, thoracic region: Secondary | ICD-10-CM | POA: Diagnosis present

## 2023-01-14 DIAGNOSIS — Z79899 Other long term (current) drug therapy: Secondary | ICD-10-CM | POA: Diagnosis not present

## 2023-01-14 DIAGNOSIS — F419 Anxiety disorder, unspecified: Secondary | ICD-10-CM | POA: Diagnosis present

## 2023-01-14 DIAGNOSIS — Z8261 Family history of arthritis: Secondary | ICD-10-CM | POA: Diagnosis not present

## 2023-01-14 DIAGNOSIS — R32 Unspecified urinary incontinence: Secondary | ICD-10-CM | POA: Diagnosis present

## 2023-01-14 DIAGNOSIS — Z79890 Hormone replacement therapy (postmenopausal): Secondary | ICD-10-CM | POA: Diagnosis not present

## 2023-01-14 DIAGNOSIS — E039 Hypothyroidism, unspecified: Secondary | ICD-10-CM | POA: Diagnosis present

## 2023-01-14 DIAGNOSIS — M25552 Pain in left hip: Secondary | ICD-10-CM | POA: Diagnosis present

## 2023-01-14 LAB — CBC
HCT: 38.5 % (ref 36.0–46.0)
Hemoglobin: 13.4 g/dL (ref 12.0–15.0)
MCH: 31.8 pg (ref 26.0–34.0)
MCHC: 34.8 g/dL (ref 30.0–36.0)
MCV: 91.4 fL (ref 80.0–100.0)
Platelets: 154 10*3/uL (ref 150–400)
RBC: 4.21 MIL/uL (ref 3.87–5.11)
RDW: 12.1 % (ref 11.5–15.5)
WBC: 4.7 10*3/uL (ref 4.0–10.5)
nRBC: 0 % (ref 0.0–0.2)

## 2023-01-14 LAB — BASIC METABOLIC PANEL
Anion gap: 7 (ref 5–15)
BUN: 17 mg/dL (ref 8–23)
CO2: 25 mmol/L (ref 22–32)
Calcium: 8.6 mg/dL — ABNORMAL LOW (ref 8.9–10.3)
Chloride: 105 mmol/L (ref 98–111)
Creatinine, Ser: 0.58 mg/dL (ref 0.44–1.00)
GFR, Estimated: >60 mL/min (ref >60)
Glucose, Bld: 97 mg/dL (ref 70–99)
Potassium: 4.1 mmol/L (ref 3.5–5.1)
Sodium: 137 mmol/L (ref 135–145)

## 2023-01-14 MED ORDER — SENNA 8.6 MG PO TABS
1.0000 | ORAL_TABLET | Freq: Every day | ORAL | Status: DC
  Administered 2023-01-14 – 2023-01-22 (×8): 8.6 mg via ORAL
  Filled 2023-01-14 (×8): qty 1

## 2023-01-14 MED ORDER — HYDROMORPHONE HCL 1 MG/ML IJ SOLN
0.5000 mg | INTRAMUSCULAR | Status: DC | PRN
  Administered 2023-01-15 – 2023-01-16 (×6): 0.5 mg via INTRAVENOUS
  Filled 2023-01-14 (×7): qty 0.5

## 2023-01-14 MED ORDER — POLYETHYLENE GLYCOL 3350 17 G PO PACK
17.0000 g | PACK | Freq: Every day | ORAL | Status: DC | PRN
  Administered 2023-01-14 – 2023-01-22 (×5): 17 g via ORAL
  Filled 2023-01-14 (×5): qty 1

## 2023-01-14 MED ORDER — ACETAMINOPHEN 325 MG PO TABS
650.0000 mg | ORAL_TABLET | Freq: Two times a day (BID) | ORAL | Status: DC
  Administered 2023-01-14 – 2023-01-23 (×18): 650 mg via ORAL
  Filled 2023-01-14 (×18): qty 2

## 2023-01-14 MED ORDER — OXYCODONE HCL 5 MG PO TABS
10.0000 mg | ORAL_TABLET | ORAL | Status: DC | PRN
  Administered 2023-01-14 – 2023-01-16 (×10): 10 mg via ORAL
  Filled 2023-01-14 (×10): qty 2

## 2023-01-14 NOTE — Progress Notes (Signed)
Physical Therapy Treatment Patient Details Name: Christina Howell MRN: [id number] DOB: 22-Oct-1955 Today's Date: 01/14/2023   History of Present Illness 67 yo female slipped and fell while bowling with family. LLE went into extreme hip flexion with knee extension and landed on Left hip. x rays are negative however pt cannot straighten L LE withotu sever pain in posterior L upper thigh ( hamstring) area. MRI (+) complete avulsion L hamstring tendon-orthopedics consulted-recommend non-op mgmt, f/u as OP, WBAT    PT Comments  Mobility remains significantly limited by pain-rated 10/10 with mobility. Pt still unable to tolerate getting to EOB. Unfortunately, pain not controlled enough to allow for OOB mobility. Will continue to follow and progress activity as pt is able to tolerate.     If plan is discharge home, recommend the following: A lot of help with walking and/or transfers;A lot of help with bathing/dressing/bathroom;Assistance with cooking/housework;Help with stairs or ramp for entrance   Can travel by private vehicle        Equipment Recommendations  Rolling walker (2 wheels) (has access to crutches already)    Recommendations for Other Services       Precautions / Restrictions Restrictions Weight Bearing Restrictions: Yes LLE Weight Bearing: Weight bearing as tolerated     Mobility  Bed Mobility Overal bed mobility: Needs Assistance Bed Mobility: Supine to Sit, Sit to Supine     Supine to sit: Min assist Sit to supine: Supervision   General bed mobility comments: HHA provided to pt at her request. Pt can move LEs but cannot tolerate L LE not being unsupported and extended. In supine, HOB elevated ~30 degrees with knee ext ~20-30 degrees from 0 somewhat tolerable. Pt unable to tolerate getting into sitting EOB. Immediately returned to supine for relief.    Transfers                   General transfer comment: NT-pt unable    Ambulation/Gait                    Stairs             Wheelchair Mobility     Tilt Bed    Modified Rankin (Stroke Patients Only)       Balance                                            Cognition Arousal: Alert Behavior During Therapy: WFL for tasks assessed/performed Overall Cognitive Status: Within Functional Limits for tasks assessed                                          Exercises      General Comments        Pertinent Vitals/Pain Pain Assessment Pain Assessment: 0-10 Pain Score: 10-Worst pain ever Pain Location: in supine 6/10. with mobility 10/10 Pain Descriptors / Indicators: Sharp, Shooting, Stabbing, Throbbing Pain Intervention(s): Limited activity within patient's tolerance, Monitored during session, Repositioned, Ice applied    Home Living                          Prior Function            PT Goals (current goals can now be found  in the care plan section) Progress towards PT goals: Not progressing toward goals - comment (remains limited by pain)    Frequency    Min 1X/week      PT Plan      Co-evaluation              AM-PAC PT "6 Clicks" Mobility   Outcome Measure  Help needed turning from your back to your side while in a flat bed without using bedrails?: A Little Help needed moving from lying on your back to sitting on the side of a flat bed without using bedrails?: A Little Help needed moving to and from a bed to a chair (including a wheelchair)?: Total Help needed standing up from a chair using your arms (e.g., wheelchair or bedside chair)?: Total Help needed to walk in hospital room?: Total Help needed climbing 3-5 steps with a railing? : Total 6 Click Score: 10    End of Session   Activity Tolerance: Patient limited by pain Patient left: in bed;with call bell/phone within reach;with bed alarm set   PT Visit Diagnosis: Difficulty in walking, not elsewhere classified (R26.2);Pain Pain -  Right/Left: Left Pain - part of body: Leg     Time: 1610-9604 PT Time Calculation (min) (ACUTE ONLY): 8 min  Charges:    $Therapeutic Activity: 8-22 mins PT General Charges $$ ACUTE PT VISIT: 1 Visit                         Faye Ramsay, PT Acute Rehabilitation  Office: [phone]

## 2023-01-14 NOTE — Progress Notes (Signed)
Patient's IV leaking while administering IV Dilaudid this evening, unsure if patient received any medication. Removed IV and patient is asking to wait to have a new IV placed. Provided patient education on significance of having IV access and that she has IV pain medications that I will not be able to administer until we get a new IV. Patient understands and still requesting to wait until later to place new IV. Notified NP, will try for IV access later this evening when patient is more agreeable.

## 2023-01-14 NOTE — Progress Notes (Signed)
ORTHOPEDICS:  Asked to see this nice lady about a left hip injury.  I have taken care of some of her friends and also her deceased husband, Richard.  Anyhow she was bowling and slipped and did a split.  Could not get up and was taken via ambulance to [facility].  MRI has shown hamstring avulsion on left 3-4 cm displaced.  She is in significant pain and having trouble mobilizing.  Exam notable for pain with hamstring stretch but intact sensation and motor function distally.  Discussed with Mynesha and her partner in detail.  Recommend nonsurgical management with gradual mobilization.  May WBAT but will need walker for at least a month and maybe longer.  In rare instances we surgically repair but usually in people <40yo, very active, and displaced a bit more.  I suspect it will take a couple more days to mobilize completely for DC home.  I need to see her back in my office in about 10 days.

## 2023-01-14 NOTE — TOC Initial Note (Signed)
Transition of Care [facility]) - Initial/Assessment Note   Patient Details  Name: Christina Howell MRN: [id number] Date of Birth: 23-Feb-1956  Transition of Care [facility] CM/SW Contact:    Ewing Schlein, LCSW Phone Number: 01/14/2023, 3:33 PM  Clinical Narrative: PT evaluation recommended a rolling walker, but no PT follow up. Patient agreeable to having [employer] deliver a rolling walker to her room. Hospitalist placed DME order. CSW made referral to [facility] with [employer]. [employer] to deliver rolling walker to patient's room.                 Expected Discharge Plan: Home/Self Care Barriers to Discharge: Continued Medical Work up  Patient Goals and CMS Choice Patient states their goals for this hospitalization and ongoing recovery are:: Return home CMS Medicare.gov Compare Post Acute Care list provided to:: Patient Choice offered to / list presented to : Patient  Expected Discharge Plan and Services In-house Referral: Clinical Social Work Post Acute Care Choice: Durable Medical Equipment Living arrangements for the past 2 months: Single Family Home           DME Arranged: Walker rolling DME Agency: [employer] Date DME Agency Contacted: 01/14/23 Time DME Agency Contacted: 1431 Representative spoke with at DME Agency: Selena Batten  Prior Living Arrangements/Services Living arrangements for the past 2 months: Single Family Home Lives with:: Significant Other Patient language and need for interpreter reviewed:: Yes Do you feel safe going back to the place where you live?: Yes      Need for Family Participation in Patient Care: No (Comment) Care giver support system in place?: Yes (comment) Criminal Activity/Legal Involvement Pertinent to Current Situation/Hospitalization: No - Comment as needed  Activities of Daily Living ADL Screening (condition at time of admission) Independently performs ADLs?: No Does the patient have a NEW difficulty with bathing/dressing/toileting/self-feeding that is expected to last >3  days?: Yes (Initiates electronic notice to provider for possible OT consult) Does the patient have a NEW difficulty with getting in/out of bed, walking, or climbing stairs that is expected to last >3 days?: Yes (Initiates electronic notice to provider for possible PT consult) Does the patient have a NEW difficulty with communication that is expected to last >3 days?: No Is the patient deaf or have difficulty hearing?: No Does the patient have difficulty seeing, even when wearing glasses/contacts?: No Does the patient have difficulty concentrating, remembering, or making decisions?: No  Permission Sought/Granted Permission sought to share information with : Other (comment) Permission granted to share information with : Yes, Verbal Permission Granted Permission granted to share info w AGENCY: [employer]  Emotional Assessment Attitude/Demeanor/Rapport: Engaged Affect (typically observed): Accepting Orientation: : Oriented to Situation, Oriented to  Time, Oriented to Place, Oriented to Self Alcohol / Substance Use: Not Applicable Psych Involvement: No (comment)  Admission diagnosis:  Fall, initial encounter [W19.XXXA] Acute hip pain, left [M25.552] Avulsion of hamstring muscle [S76.399A] Patient Active Problem List   Diagnosis Date Noted   Avulsion of hamstring muscle 01/14/2023   Fall 01/13/2023   Hypokalemia 01/13/2023   Acquired hypothyroidism 01/13/2023   Urinary incontinence 01/13/2023   GERD (gastroesophageal reflux disease) 01/13/2023   Acute hip pain, left 01/12/2023   Acute pain of right knee 01/29/2018   Throat congestion 09/07/2010   Chest tightness 07/22/2010   PCP:  [name], MD Pharmacy:   [id number] [place], [facility] - [address] [place] [address] Phone: [phone] Fax: [phone]  [facility] [address] [place], [place] [address] [place] [address] Phone: [phone] Fax:  [phone]  Social Determinants of Health (SDOH) Social History: SDOH Screenings   Food Insecurity: No Food Insecurity (01/13/2023)  Housing: Low Risk  (01/13/2023)  Transportation Needs: No Transportation Needs (01/13/2023)  Utilities: Not At Risk (01/13/2023)  Social Connections: Unknown (07/12/2021)   Received from [facility]  Tobacco Use: Low Risk  (01/13/2023)   SDOH Interventions:    Readmission Risk Interventions     No data to display

## 2023-01-14 NOTE — Hospital Course (Signed)
67 y.o. female with past history of acquired hypothyroidism, GERD, who presented to [facility] on 01/12/2023 with acute left hip pain after ground-level mechanical fall as outpatient after presenting from home to [facility] ED complaining of acute left hip pain after falling while bowling.  Initial x-ray workup was negative in the emergency department but due to intractable pain and difficulty with ambulation the hospitalist group was called to assess the patient for admission the hospital.  After thorough evaluation MRI imaging reveals complete avulsion of the left hamstring tendon with 3 to 4 cm of tendon retraction.  Case was discussed with Dr. [name] with orthopedic surgery who recommended crutches, weightbearing as tolerated and outpatient evaluation by orthopedic surgery in 1 week.    Opiate-based analgesics have been used due to patient substantial associated pain.

## 2023-01-14 NOTE — Discharge Summary (Incomplete)
Physician Discharge Summary   Patient: Christina Howell MRN: [id number] DOB: [birth year]  Admit date:     01/12/2023  Discharge date: {dischdate:26783}  Discharge Physician: Marinda Elk   PCP: [name], MD   Recommendations at discharge:  {Tip this will not be part of the note when signed- Example include specific recommendations for outpatient follow-up, pending tests to follow-up on. (Optional):26781}  ***  Discharge Diagnoses: Principal Problem:   Acute hip pain, left Active Problems:   Fall   Hypokalemia   Acquired hypothyroidism   Urinary incontinence   GERD (gastroesophageal reflux disease)  Resolved Problems:   * No resolved hospital problems. *   Hospital Course: 67 y.o. female with past history of acquired hypothyroidism, GERD, who presented to [facility] on 01/12/2023 with acute left hip pain after ground-level mechanical fall as outpatient after presenting from home to [facility] ED complaining of acute left hip pain after falling while bowling.  Initial x-ray workup was negative in the emergency department but due to intractable pain and difficulty with ambulation the hospitalist group was called to assess the patient for admission the hospital.  After thorough evaluation MRI imaging reveals complete avulsion of the left hamstring tendon with 3 to 4 cm of tendon retraction.  Case was discussed with Dr. [name] with orthopedic surgery who recommended crutches, weightbearing as tolerated and outpatient evaluation by orthopedic surgery in 1 week.    Opiate-based analgesics have been used due to patient substantial associated pain.  {Tip this will not be part of the note when signed Body mass index is 27.39 kg/m. , ,  (Optional):26781}  {(NOTE) Pain control PDMP Statment (Optional):26782}  Consultants: *** Procedures performed: ***  Disposition: {Plan; Disposition:26390} Diet recommendation:  {Diet_Plan:26776}  DISCHARGE MEDICATION: Allergies as of  01/14/2023       Reactions   Diphenhydramine Hcl Other (See Comments)   hyper     Med Rec must be completed prior to using this [facility]***        Discharge Exam: Filed Weights   01/12/23 1658 01/13/23 0421 01/14/23 4098  Weight: 76.2 kg 80.1 kg 81.7 kg   *** Constitutional: Awake alert and oriented x3, no associated distress.   Respiratory: clear to auscultation bilaterally, no wheezing, no crackles. Normal respiratory effort. No accessory muscle use.  Cardiovascular: Regular rate and rhythm, no murmurs / rubs / gallops. No extremity edema. 2+ pedal pulses. No carotid bruits.  Abdomen: Abdomen is soft and nontender.  No evidence of intra-abdominal masses.  Positive bowel sounds noted in all quadrants.   Musculoskeletal: No joint deformity upper and lower extremities. Good ROM, no contractures. Normal muscle tone.     Condition at discharge: {DC Condition:26389}  The results of significant diagnostics from this hospitalization (including imaging, microbiology, ancillary and laboratory) are listed below for reference.   Imaging Studies: MR FEMUR LEFT WO CONTRAST  Result Date: 01/13/2023 CLINICAL DATA:  Upper leg pain, stress fracture suspected, neg xray Lower extremity trauma, penetrating Left hamstring tendon area pain EXAM: MR OF THE LEFT FEMUR WITHOUT CONTRAST TECHNIQUE: Multiplanar, multisequence MR imaging of the left femur was performed. No intravenous contrast was administered. COMPARISON:  X-ray 01/12/2023 FINDINGS: Bones/Joint/Cartilage Left femur intact without fracture or dislocation. No significant arthropathy of the left hip or knee. No left hip joint effusion. No avulsion fracture of the ischial tuberosity. No bone marrow edema. No marrow replacing bone lesion. Ligaments Intact. Muscles and Tendons Complete avulsion of the tendons at the left hamstring tendon origin with  3-4 cm of tendon retraction. The left gluteal iliopsoas, rectus femoris, and adductor tendons  appear intact without tear or significant tendinosis. Tendinous structures about the knee are intact. Intramuscular edema within the proximal hamstring and adductor musculature. Soft tissues Prominent fluid and edema surrounds the site of left hamstring tendon origin tear. In total, fluid collection measures approximately 15 x 5 x 5 cm. Remainder of the soft tissues are otherwise unremarkable. No left inguinal lymphadenopathy. IMPRESSION: 1. Complete avulsion of the left hamstring tendon origin with 3-4 cm of tendon retraction. 2. Prominent fluid and edema surrounds the site of left hamstring tendon origin tear. In total, fluid collection measures approximately 15 x 5 x 5 cm. 3. Left femur intact without fracture or dislocation. Electronically Signed   By: Duanne Guess D.O.   On: 01/13/2023 17:54   DG Femur 1V Left  Result Date: 01/12/2023 CLINICAL DATA:  Left thigh pain EXAM: LEFT FEMUR 1 VIEW COMPARISON:  None Available. FINDINGS: There is no evidence of fracture or other focal bone lesions. Soft tissues are unremarkable. IMPRESSION: Negative. Electronically Signed   By: Jasmine Pang M.D.   On: 01/12/2023 22:00   CT PELVIS WO CONTRAST  Result Date: 01/12/2023 CLINICAL DATA:  Trauma. EXAM: CT PELVIS WITHOUT CONTRAST TECHNIQUE: Multidetector CT imaging of the pelvis was performed following the standard protocol without intravenous contrast. RADIATION DOSE REDUCTION: This exam was performed according to the departmental dose-optimization program which includes automated exposure control, adjustment of the mA and/or kV according to patient size and/or use of iterative reconstruction technique. COMPARISON:  None Available. FINDINGS: Urinary Tract:  No abnormality visualized. Bowel: Unremarkable visualized pelvic bowel loops. Normal appendix. 41 Vascular/Lymphatic: No pathologically enlarged lymph nodes. Prominent superficial vessels in the right inguinal region consistent with collateralization.  Reproductive: No mass or other significant abnormality. Status post hysterectomy. Other:  None. Musculoskeletal: No evidence of fracture. No suspicious bone lesions identified. Facet joint degenerative changes at the lumbosacral junction. IMPRESSION: No acute pelvic pathology identified. Electronically Signed   By: Layla Maw M.D.   On: 01/12/2023 19:33   DG Hip Unilat With Pelvis 2-3 Views Left  Result Date: 01/12/2023 CLINICAL DATA:  Fall, pain. EXAM: DG HIP (WITH OR WITHOUT PELVIS) 3V LEFT COMPARISON:  05/13/2018. FINDINGS: There is no evidence of hip fracture or dislocation. There is no evidence of arthropathy or other focal bone abnormality. There are lumbosacral degenerative changes. IMPRESSION: Lumbosacral degenerative changes.  No acute osseous abnormalities. Electronically Signed   By: Layla Maw M.D.   On: 01/12/2023 19:28   MM 3D SCREENING MAMMOGRAM BILATERAL BREAST  Result Date: 01/03/2023 CLINICAL DATA:  Screening. EXAM: DIGITAL SCREENING BILATERAL MAMMOGRAM WITH TOMOSYNTHESIS AND CAD TECHNIQUE: Bilateral screening digital craniocaudal and mediolateral oblique mammograms were obtained. Bilateral screening digital breast tomosynthesis was performed. The images were evaluated with computer-aided detection. COMPARISON:  Previous exam(s). ACR Breast Density Category c: The breasts are heterogeneously dense, which may obscure small masses. FINDINGS: There are no findings suspicious for malignancy. IMPRESSION: No mammographic evidence of malignancy. A result letter of this screening mammogram will be mailed directly to the patient. RECOMMENDATION: Screening mammogram in one year. (Code:SM-B-01Y) BI-RADS CATEGORY  1: Negative. Electronically Signed   By: Sherian Rein M.D.   On: 01/03/2023 13:52    Microbiology: No results found for this or any previous visit.  Labs: CBC: Recent Labs  Lab 01/12/23 2110 01/13/23 0509 01/14/23 0728  WBC 7.0 6.3 4.7  NEUTROABS 4.5 3.9  --   HGB  11.6* 12.7 13.4  HCT 34.4* 38.0 38.5  MCV 92.5 91.3 91.4  PLT 163 181 154   Basic Metabolic Panel: Recent Labs  Lab 01/12/23 2110 01/13/23 0509 01/14/23 0728  NA 139 133* 137  K 3.2* 3.6 4.1  CL 109 102 105  CO2 23 24 25   GLUCOSE 103* 101* 97  BUN 14 13 17   CREATININE 0.53 0.61 0.58  CALCIUM 8.0* 8.5* 8.6*  MG 2.1 2.1  --    Liver Function Tests: Recent Labs  Lab 01/13/23 0509  AST 20  ALT 20  ALKPHOS 44  BILITOT 1.1  PROT 5.9*  ALBUMIN 3.8   CBG: No results for input(s): "GLUCAP" in the last 168 hours.  Discharge time spent: {LESS THAN/GREATER QMVH:84696} 30 minutes.  Signed: Marinda Elk, MD [facility] 01/14/2023

## 2023-01-14 NOTE — Plan of Care (Signed)
Pt is A&O x 4. VSS on room air. C/o left hip/leg pain and pain managed with prn IV dilaudid and IV toradol.   Educated on falls and safety precautions, plan of care, pt verbalizes understanding. Safety maintained. Bed alarm on. Call bell in reach. Will continue to monitor.   Problem: Education: Goal: Knowledge of General Education information will improve Description: Including pain rating scale, medication(s)/side effects and non-pharmacologic comfort measures Outcome: Progressing   Problem: Health Behavior/Discharge Planning: Goal: Ability to manage health-related needs will improve Outcome: Progressing   Problem: Clinical Measurements: Goal: Ability to maintain clinical measurements within normal limits will improve Outcome: Progressing Goal: Will remain free from infection Outcome: Progressing Goal: Diagnostic test results will improve Outcome: Progressing Goal: Respiratory complications will improve Outcome: Progressing Goal: Cardiovascular complication will be avoided Outcome: Progressing   Problem: Activity: Goal: Risk for activity intolerance will decrease Outcome: Progressing   Problem: Nutrition: Goal: Adequate nutrition will be maintained Outcome: Progressing   Problem: Coping: Goal: Level of anxiety will decrease Outcome: Progressing   Problem: Elimination: Goal: Will not experience complications related to bowel motility Outcome: Progressing Goal: Will not experience complications related to urinary retention Outcome: Progressing   Problem: Pain Management: Goal: General experience of comfort will improve Outcome: Progressing   Problem: Safety: Goal: Ability to remain free from injury will improve Outcome: Progressing   Problem: Skin Integrity: Goal: Risk for impaired skin integrity will decrease Outcome: Progressing

## 2023-01-14 NOTE — Plan of Care (Signed)
Plan of Care reviewed. 

## 2023-01-15 DIAGNOSIS — S76392A Other specified injury of muscle, fascia and tendon of the posterior muscle group at thigh level, left thigh, initial encounter: Secondary | ICD-10-CM | POA: Diagnosis not present

## 2023-01-15 DIAGNOSIS — W010XXA Fall on same level from slipping, tripping and stumbling without subsequent striking against object, initial encounter: Secondary | ICD-10-CM

## 2023-01-15 LAB — CBC
HCT: 37.7 % (ref 36.0–46.0)
Hemoglobin: 13.2 g/dL (ref 12.0–15.0)
MCH: 31.9 pg (ref 26.0–34.0)
MCHC: 35 g/dL (ref 30.0–36.0)
MCV: 91.1 fL (ref 80.0–100.0)
Platelets: 163 10*3/uL (ref 150–400)
RBC: 4.14 MIL/uL (ref 3.87–5.11)
RDW: 11.9 % (ref 11.5–15.5)
WBC: 4.9 10*3/uL (ref 4.0–10.5)
nRBC: 0 % (ref 0.0–0.2)

## 2023-01-15 NOTE — Evaluation (Addendum)
Occupational Therapy Evaluation Patient Details Name: Christina Howell MRN: [id number] DOB: 10-Jul-1955 Today's Date: 01/15/2023   History of Present Illness Patient is a 67 year old female slipped and fell while bowling with family. LLE went into extreme hip flexion with knee extension and landed on Left hip. x rays are negative however pt cannot straighten L LE withotu sever pain in posterior L upper thigh ( hamstring) area. MRI (+) complete avulsion L hamstring tendon-orthopedics consulted-recommend non-op mgmt, f/u as OP, WBAT.   Clinical Impression   Patient is a 67 year old female who was admitted for above.  Patient was living at home independently with active lifestyle per patient report. Currently, patient was in too much pain to progress to EOB with patient tearful with idea. Patient was educated on pain management strategies (ice and positioning in bed) at bed level. Patient reported pending new IV at this time. Patient would continue to benefit from skilled OT services at this time while admitted and after d/c to address noted deficits in order to improve overall safety and independence in ADLs.        If plan is discharge home, recommend the following: Two people to help with walking and/or transfers;A lot of help with bathing/dressing/bathroom;Assistance with cooking/housework;Direct supervision/assist for medications management;Assist for transportation;Help with stairs or ramp for entrance    Functional Status Assessment  Patient has had a recent decline in their functional status and demonstrates the ability to make significant improvements in function in a reasonable and predictable amount of time.  Equipment Recommendations  Wheelchair cushion (measurements OT);Wheelchair (measurements OT);BSC/3in1       Precautions / Restrictions Precautions Precautions: Fall Restrictions Weight Bearing Restrictions: Yes LLE Weight Bearing: Weight bearing as tolerated      Mobility Bed  Mobility Overal bed mobility: Needs Assistance Bed Mobility: Rolling Rolling: Modified independent (Device/Increase time)         General bed mobility comments: patient was able to roll herself to R side in bed with patient declining to progress to EOB with current pain level. able to tolerate sidelying.            ADL either performed or assessed with clinical judgement   ADL Overall ADL's : Needs assistance/impaired Eating/Feeding: Modified independent Eating/Feeding Details (indicate cue type and reason): bed level Grooming: Set up;Bed level   Upper Body Bathing: Supervision/ safety;Bed level   Lower Body Bathing: Bed level;Total assistance Lower Body Bathing Details (indicate cue type and reason): unable to tolerate movement of leg. noted to be able to bridge up in bed with BLE in bridge positioning for ACE wrap to be added to leg by PT. Upper Body Dressing : Supervision/safety;Bed level   Lower Body Dressing: Bed level;Total assistance     Toilet Transfer Details (indicate cue type and reason): unable to tolerate progression to EOB. patient was educated on pain management and not waiting until pain was 10/10 to ask for pain medication. patient provided with ice and pillows for positioning comfort in sidelying. Toileting- Clothing Manipulation and Hygiene: Bed level;Total assistance               Vision   Vision Assessment?: No apparent visual deficits            Pertinent Vitals/Pain Pain Assessment Pain Assessment: 0-10 Pain Score: 7  Pain Location: in sidelying Pain Descriptors / Indicators: Constant, Moaning, Crying Pain Intervention(s): Limited activity within patient's tolerance, Monitored during session, Repositioned, Premedicated before session, Ice applied, Patient requesting pain meds-RN  notified     Extremity/Trunk Assessment Upper Extremity Assessment Upper Extremity Assessment: Overall WFL for tasks assessed   Lower Extremity  Assessment Lower Extremity Assessment: Defer to PT evaluation       Communication Communication Communication: No apparent difficulties   Cognition Arousal: Alert Behavior During Therapy: WFL for tasks assessed/performed Overall Cognitive Status: Within Functional Limits for tasks assessed         General Comments: tearful during session. patient reported declining IV placement in PM hours and now wants IV back in place for pain medications.                Home Living Family/patient expects to be discharged to:: Private residence Living Arrangements: Spouse/significant other Available Help at Discharge: Family Type of Home: House Home Access: Stairs to enter Secretary/administrator of Steps: 2   Home Layout: Two level;Bed/bath upstairs               Home Equipment: Crutches          Prior Functioning/Environment Prior Level of Function : Independent/Modified Independent               ADLs Comments: was doing yoga and bowling prior to hospitalization        OT Problem List: Decreased strength;Decreased activity tolerance;Impaired balance (sitting and/or standing);Decreased coordination;Decreased safety awareness;Decreased knowledge of precautions;Pain      OT Treatment/Interventions: Self-care/ADL training;Therapeutic exercise;DME and/or AE instruction;Therapeutic activities;Patient/family education;Balance training    OT Goals(Current goals can be found in the care plan section) Acute Rehab OT Goals Patient Stated Goal: to get pain under control OT Goal Formulation: With patient Time For Goal Achievement: 01/29/23 Potential to Achieve Goals: Fair  OT Frequency: Min 1X/week       AM-PAC OT "6 Clicks" Daily Activity     Outcome Measure Help from another person eating meals?: None Help from another person taking care of personal grooming?: A Little Help from another person toileting, which includes using toliet, bedpan, or urinal?: A Lot Help  from another person bathing (including washing, rinsing, drying)?: A Lot Help from another person to put on and taking off regular upper body clothing?: A Little Help from another person to put on and taking off regular lower body clothing?: A Lot 6 Click Score: 16   End of Session    Activity Tolerance: Patient limited by pain Patient left: in bed;with call bell/phone within reach  OT Visit Diagnosis: Pain;Muscle weakness (generalized) (M62.81)                Time: 2536-6440 OT Time Calculation (min): 17 min Charges:  OT General Charges $OT Visit: 1 Visit OT Evaluation $OT Eval Moderate Complexity: 1 Mod  Shloma Roggenkamp OTR/L, MS Acute Rehabilitation Department Office# [phone]   Selinda Flavin 01/15/2023, 11:26 AM

## 2023-01-15 NOTE — Assessment & Plan Note (Signed)
Continue home regimen of Protonix

## 2023-01-15 NOTE — Progress Notes (Signed)
PROGRESS NOTE    Christina Howell  ZOX:[id number] DOB: May 16, 1955 DOA: 01/12/2023 PCP: [name], MD   Brief Narrative:  This 67 y.o. female with PMH significant of acquired hypothyroidism, GERD, who presented in the ED with acute left hip pain after ground-level mechanical , Patient has been complaining of acute left hip pain after falling while bowling. Initial x-ray workup was negative in the emergency department but due to intractable pain and difficulty with ambulation , Patient was admitted for further evaluation. After thorough evaluation MRI imaging reveals complete avulsion of the left hamstring tendon with 3 to 4 cm of tendon retraction.  Case was discussed with Dr. [name] with orthopedic surgery who recommended crutches, weightbearing as tolerated and outpatient evaluation by orthopedic surgery in 1 week.   Opiate-based analgesics have been used due to patient substantial associated pain.  Assessment & Plan:   Active Problems:   Hypokalemia   Acquired hypothyroidism   Urinary incontinence   GERD (gastroesophageal reflux disease)   Avulsion of hamstring muscle, left, initial encounter   Fall from slipping, initial encounter   Avulsion of hamstring muscle, left, initial encounter: Dr.Daldorf with orthopedic surgery graciously evaluated the patient at the bedside after reviewing images. Plan is for titration of opiate based analgesics and slow gradual attempts to get the patient out of bed using a rolling walker. Patient is then to follow-up with Dr. [name] in orthopedic clinic in 10 days It is likely that conservative management will be pursued Pain is still substantial, patient remains hospitalized as we titrate pain medications.  Fall from slipping, initial encounter Patient slipped while bowling resulting in injury above.   Continue pain control as needed.  Hypokalemia: Replaced. Continue to monitor.  Acquired hypothyroidism Continuing home regimen of  levothyroxine.  Urinary incontinence Continue Myrbetriq.  GERD (gastroesophageal reflux disease) Continue home regimen of Protonix   DVT prophylaxis: Lovenox Code Status: Full code Family Communication: No family at bed side. Disposition Plan:   Status is: Inpatient Remains inpatient appropriate because:Admitted status post mechanical fall with avulsion fracture of hamstring muscle.  Orthopedics recommended conservative management.    Consultants:  Orthopaedics  Procedures: Antimicrobials:  Anti-infectives (From admission, onward)    None      Subjective: Patient was seen and examined at bedside.  Overnight events noted. She continued to report having significant pain in the left hamstring area.  Objective: Vitals:   01/14/23 2014 01/15/23 0345 01/15/23 0521 01/15/23 1155  BP: 119/79 117/73  134/68  Pulse: 77 69  76  Resp: 16 16  18   Temp:  98.1 F (36.7 C)  98 F (36.7 C)  TempSrc:  Oral  Oral  SpO2: 99% 98%  100%  Weight:   81.6 kg   Height:        Intake/Output Summary (Last 24 hours) at 01/15/2023 1530 Last data filed at 01/15/2023 1030 Gross per 24 hour  Intake 540 ml  Output --  Net 540 ml   Filed Weights   01/13/23 0421 01/14/23 0642 01/15/23 0521  Weight: 80.1 kg 81.7 kg 81.6 kg    Examination:  General exam: Appears calm and comfortable , deconditioned, not in any acute distress. Respiratory system: Clear to auscultation. Respiratory effort normal.  RR 16 Cardiovascular system: S1 & S2 heard, RRR. No JVD, murmurs, rubs, gallops or clicks.  Gastrointestinal system: Abdomen is non distended, soft and non tender.  Normal bowel sounds heard. Central nervous system: Alert and oriented x 3. No focal neurological deficits. Extremities: Left  hamstring tenderness+, ROM : Limited. Skin: No rashes, lesions or ulcers Psychiatry: Judgement and insight appear normal. Mood & affect appropriate.     Data Reviewed: I have personally reviewed following  labs and imaging studies  CBC: Recent Labs  Lab 01/12/23 2110 01/13/23 0509 01/14/23 0728 01/15/23 0858  WBC 7.0 6.3 4.7 4.9  NEUTROABS 4.5 3.9  --   --   HGB 11.6* 12.7 13.4 13.2  HCT 34.4* 38.0 38.5 37.7  MCV 92.5 91.3 91.4 91.1  PLT 163 181 154 163   Basic Metabolic Panel: Recent Labs  Lab 01/12/23 2110 01/13/23 0509 01/14/23 0728  NA 139 133* 137  K 3.2* 3.6 4.1  CL 109 102 105  CO2 23 24 25   GLUCOSE 103* 101* 97  BUN 14 13 17   CREATININE 0.53 0.61 0.58  CALCIUM 8.0* 8.5* 8.6*  MG 2.1 2.1  --    GFR: Estimated Creatinine Clearance: 77.5 mL/min (by C-G formula based on SCr of 0.58 mg/dL). Liver Function Tests: Recent Labs  Lab 01/13/23 0509  AST 20  ALT 20  ALKPHOS 44  BILITOT 1.1  PROT 5.9*  ALBUMIN 3.8   No results for input(s): "LIPASE", "AMYLASE" in the last 168 hours. No results for input(s): "AMMONIA" in the last 168 hours. Coagulation Profile: No results for input(s): "INR", "PROTIME" in the last 168 hours. Cardiac Enzymes: No results for input(s): "CKTOTAL", "CKMB", "CKMBINDEX", "TROPONINI" in the last 168 hours. BNP (last 3 results) No results for input(s): "PROBNP" in the last 8760 hours. HbA1C: No results for input(s): "HGBA1C" in the last 72 hours. CBG: No results for input(s): "GLUCAP" in the last 168 hours. Lipid Profile: No results for input(s): "CHOL", "HDL", "LDLCALC", "TRIG", "CHOLHDL", "LDLDIRECT" in the last 72 hours. Thyroid Function Tests: No results for input(s): "TSH", "T4TOTAL", "FREET4", "T3FREE", "THYROIDAB" in the last 72 hours. Anemia Panel: No results for input(s): "VITAMINB12", "FOLATE", "FERRITIN", "TIBC", "IRON", "RETICCTPCT" in the last 72 hours. Sepsis Labs: No results for input(s): "PROCALCITON", "LATICACIDVEN" in the last 168 hours.  No results found for this or any previous visit (from the past 240 hour(s)).   Radiology Studies: MR FEMUR LEFT WO CONTRAST  Result Date: 01/13/2023 CLINICAL DATA:  Upper leg  pain, stress fracture suspected, neg xray Lower extremity trauma, penetrating Left hamstring tendon area pain EXAM: MR OF THE LEFT FEMUR WITHOUT CONTRAST TECHNIQUE: Multiplanar, multisequence MR imaging of the left femur was performed. No intravenous contrast was administered. COMPARISON:  X-ray 01/12/2023 FINDINGS: Bones/Joint/Cartilage Left femur intact without fracture or dislocation. No significant arthropathy of the left hip or knee. No left hip joint effusion. No avulsion fracture of the ischial tuberosity. No bone marrow edema. No marrow replacing bone lesion. Ligaments Intact. Muscles and Tendons Complete avulsion of the tendons at the left hamstring tendon origin with 3-4 cm of tendon retraction. The left gluteal iliopsoas, rectus femoris, and adductor tendons appear intact without tear or significant tendinosis. Tendinous structures about the knee are intact. Intramuscular edema within the proximal hamstring and adductor musculature. Soft tissues Prominent fluid and edema surrounds the site of left hamstring tendon origin tear. In total, fluid collection measures approximately 15 x 5 x 5 cm. Remainder of the soft tissues are otherwise unremarkable. No left inguinal lymphadenopathy. IMPRESSION: 1. Complete avulsion of the left hamstring tendon origin with 3-4 cm of tendon retraction. 2. Prominent fluid and edema surrounds the site of left hamstring tendon origin tear. In total, fluid collection measures approximately 15 x 5 x 5 cm. 3.  Left femur intact without fracture or dislocation. Electronically Signed   By: Duanne Guess D.O.   On: 01/13/2023 17:54    Scheduled Meds:  acetaminophen  650 mg Oral BID   levothyroxine  50 mcg Oral Q0600   lidocaine  1 patch Transdermal Q24H   mirabegron ER  50 mg Oral QHS   pantoprazole  20 mg Oral Daily   senna  1 tablet Oral QHS   Continuous Infusions:   LOS: 1 day    Time spent: 50 MINS    Willeen Niece, MD [facility]   If 7PM-7AM,  please contact night-coverage

## 2023-01-15 NOTE — Assessment & Plan Note (Signed)
Continue Myrbetriq 

## 2023-01-15 NOTE — Assessment & Plan Note (Signed)
Patient slipped while bowling resulting in injury above.   See assessment and plan above

## 2023-01-15 NOTE — Progress Notes (Addendum)
PROGRESS NOTE   Christina Howell  NGE:[id number] DOB: [birth year] DOA: 01/12/2023 PCP: [name], MD   Date of Service: the patient was seen and examined on 01/14/2023  Brief Narrative:  67 y.o. female with past history of acquired hypothyroidism, GERD, who presented to [facility] on 01/12/2023 with acute left hip pain after ground-level mechanical fall as outpatient after presenting from home to [facility] ED complaining of acute left hip pain after falling while bowling.  Initial x-ray workup was negative in the emergency department but due to intractable pain and difficulty with ambulation the hospitalist group was called to assess the patient for admission the hospital.  After thorough evaluation MRI imaging reveals complete avulsion of the left hamstring tendon with 3 to 4 cm of tendon retraction.  Case was discussed with Dr. [name] with orthopedic surgery who recommended crutches, weightbearing as tolerated and outpatient evaluation by orthopedic surgery in 1 week.    Opiate-based analgesics have been used due to patient substantial associated pain.   Assessment & Plan Avulsion of hamstring muscle, left, initial encounter Dr.Daldorf with orthopedic surgery graciously came to evaluate the patient at the bedside after reviewing images with me the evening of 11/17 Plan is for titration of opiate based analgesics and slow gradual attempts to get the patient out of bed using a rolling walker. Patient is then to follow-up with Dr. [name] in orthopedic clinic in 10 days It is likely that conservative management will be pursued Pain is still substantial, patient remains hospitalized as we titrate pain medications Fall from slipping, initial encounter Patient slipped while bowling resulting in injury above.   See assessment and plan above Hypokalemia Replaced Acquired hypothyroidism Continuing home regimen of levothyroxine Urinary incontinence Continue Myrbetriq GERD  (gastroesophageal reflux disease) Continue home regimen of Protonix     Subjective:  Patient continuing to complain of severe pain of the left posterior thigh.  Pain is 10 out of 10, worse with any movement of the affected extremity, radiating proximally.  Pain is minimally improved with as needed opiates.  Physical Exam:  Vitals:   01/14/23 0642 01/14/23 1358 01/14/23 1958 01/14/23 2014  BP:  109/63 (!) 116/59 119/79  Pulse:  78 77 77  Resp:  16 18 16   Temp:  97.6 F (36.4 C) 98.4 F (36.9 C)   TempSrc:  Oral Oral   SpO2:  100% 97% 99%  Weight: 81.7 kg     Height:        Constitutional: Awake alert and oriented x3, in stress due to pain. Skin: no rashes, no lesions, good skin turgor noted. Eyes: Pupils are equally reactive to light.  No evidence of scleral icterus or conjunctival pallor.  ENMT: Moist mucous membranes noted.  Posterior pharynx clear of any exudate or lesions.   Respiratory: clear to auscultation bilaterally, no wheezing, no crackles. Normal respiratory effort. No accessory muscle use.  Cardiovascular: Regular rate and rhythm, no murmurs / rubs / gallops. No extremity edema. 2+ pedal pulses. No carotid bruits.  Abdomen: Abdomen is soft and nontender.  No evidence of intra-abdominal masses.  Positive bowel sounds noted in all quadrants.   Musculoskeletal: Severe pain with both passive and active range of motion of the left lower extremity.  Data Reviewed:  I have personally reviewed and interpreted labs, imaging.  Significant findings are   CBC: Recent Labs  Lab 01/12/23 2110 01/13/23 0509 01/14/23 0728  WBC 7.0 6.3 4.7  NEUTROABS 4.5 3.9  --   HGB 11.6* 12.7  13.4  HCT 34.4* 38.0 38.5  MCV 92.5 91.3 91.4  PLT 163 181 154   Basic Metabolic Panel: Recent Labs  Lab 01/12/23 2110 01/13/23 0509 01/14/23 0728  NA 139 133* 137  K 3.2* 3.6 4.1  CL 109 102 105  CO2 23 24 25   GLUCOSE 103* 101* 97  BUN 14 13 17   CREATININE 0.53 0.61 0.58  CALCIUM  8.0* 8.5* 8.6*  MG 2.1 2.1  --    GFR: Estimated Creatinine Clearance: 77.5 mL/min (by C-G formula based on SCr of 0.58 mg/dL). Liver Function Tests: Recent Labs  Lab 01/13/23 0509  AST 20  ALT 20  ALKPHOS 44  BILITOT 1.1  PROT 5.9*  ALBUMIN 3.8     Code Status:  Full code.  Code status decision has been confirmed with: patient     Severity of Illness:  The appropriate patient status for this patient is INPATIENT. Inpatient status is judged to be reasonable and necessary in order to provide the required intensity of service to ensure the patient's safety. The patient's presenting symptoms, physical exam findings, and initial radiographic and laboratory data in the context of their chronic comorbidities is felt to place them at high risk for further clinical deterioration. Furthermore, it is not anticipated that the patient will be medically stable for discharge from the hospital within 2 midnights of admission.   * I certify that at the point of admission it is my clinical judgment that the patient will require inpatient hospital care spanning beyond 2 midnights from the point of admission due to high intensity of service, high risk for further deterioration and high frequency of surveillance required.*  Time spent:  45 minutes  Author:  Marinda Elk MD  01/14/2023

## 2023-01-15 NOTE — Assessment & Plan Note (Signed)
Replaced. °

## 2023-01-15 NOTE — Progress Notes (Signed)
Patient refusing morning labs, NP notified and aware. Patient still refusing to have IV placed at this time, Hospitalist is aware.

## 2023-01-15 NOTE — Assessment & Plan Note (Addendum)
Dr.Daldorf with orthopedic surgery graciously came to evaluate the patient at the bedside after reviewing images with me the evening of 11/17 Plan is for titration of opiate based analgesics and slow gradual attempts to get the patient out of bed using a rolling walker. Patient is then to follow-up with Dr. [name] in orthopedic clinic in 10 days It is likely that conservative management will be pursued Pain is still substantial, patient remains hospitalized as we titrate pain medications

## 2023-01-15 NOTE — Assessment & Plan Note (Signed)
Continuing home regimen of levothyroxine

## 2023-01-15 NOTE — Plan of Care (Signed)

## 2023-01-15 NOTE — Progress Notes (Signed)
Physical Therapy Treatment Patient Details Name: Christina Howell MRN: [id number] DOB: [birth year] Today's Date: 01/15/2023   History of Present Illness Patient is a 67 year old female slipped and fell while bowling with family. LLE went into extreme hip flexion with knee extension and landed on Left hip. x rays are negative however pt cannot straighten L LE withotu sever pain in posterior L upper thigh ( hamstring) area. MRI (+) complete avulsion L hamstring tendon-orthopedics consulted-recommend non-op mgmt, f/u as OP, WBAT. PMH:  L TKA,    PT Comments   Pt admitted with above diagnosis.  Pt currently with functional limitations due to the deficits listed below (see PT Problem List). Pt in bed when PT arrived. Pt indicated 10/10 L LE pain. Pt reported the oxy is just not managing her pain. Pt declined IV replacement over night and again in am and is now requesting IV be placed for pain medication and management. Pt was unable to progress with functional mobility tasks today. PT donned ace wrap for light compression on proximal L LE and CP in place. Pt encouraged to reposition in bed, pt is able to roll to the R side mod I and reposition L LE. PT used pillows as props for comfort and to offload back and buttocks. Pt left in R side lying and all needs in place. Pt may require increased supervision and assist at time of d/c. Pt will benefit from acute skilled PT to increase their independence and safety with mobility to allow discharge.      If plan is discharge home, recommend the following: A lot of help with walking and/or transfers;A lot of help with bathing/dressing/bathroom;Assistance with cooking/housework;Help with stairs or ramp for entrance   Can travel by private vehicle        Equipment Recommendations  Rolling walker (2 wheels) (has access to crutches already)    Recommendations for Other Services       Precautions / Restrictions Precautions Precautions: Fall Restrictions Weight  Bearing Restrictions: Yes LLE Weight Bearing: Weight bearing as tolerated     Mobility  Bed Mobility Overal bed mobility: Needs Assistance Bed Mobility: Rolling Rolling: Modified independent (Device/Increase time)         General bed mobility comments: patient was able to roll herself to R side in bed with patient declining to progress to EOB with current pain level. able to tolerate R sidelying.    Transfers                   General transfer comment: NT-pt unable    Ambulation/Gait               General Gait Details: NT pt unable   Stairs             Wheelchair Mobility     Tilt Bed    Modified Rankin (Stroke Patients Only)       Balance                                            Cognition Arousal: Alert Behavior During Therapy: WFL for tasks assessed/performed Overall Cognitive Status: Within Functional Limits for tasks assessed                                 General Comments: tearful during session.  patient reported declining IV placement in PM hours and now wants IV back in place for pain medications.        Exercises      General Comments        Pertinent Vitals/Pain Pain Assessment Pain Assessment: 0-10 Pain Score: 10-Worst pain ever Pain Location: L proximal LE Pain Descriptors / Indicators: Constant, Moaning, Crying, Discomfort, Restless Pain Intervention(s): Limited activity within patient's tolerance, Monitored during session, Premedicated before session, Repositioned, Ice applied    Home Living Family/patient expects to be discharged to:: Private residence Living Arrangements: Spouse/significant other Available Help at Discharge: Family Type of Home: House Home Access: Stairs to enter   Secretary/administrator of Steps: 2   Home Layout: Two level;Bed/bath upstairs Home Equipment: Crutches      Prior Function            PT Goals (current goals can now be found in the  care plan section) Acute Rehab PT Goals Patient Stated Goal: I want this to be better to get back to my independence self PT Goal Formulation: With patient Time For Goal Achievement: 01/27/23 Potential to Achieve Goals: Good Progress towards PT goals: Not progressing toward goals - comment    Frequency    Min 1X/week      PT Plan      Co-evaluation              AM-PAC PT "6 Clicks" Mobility   Outcome Measure  Help needed turning from your back to your side while in a flat bed without using bedrails?: A Little Help needed moving from lying on your back to sitting on the side of a flat bed without using bedrails?: Total Help needed moving to and from a bed to a chair (including a wheelchair)?: Total Help needed standing up from a chair using your arms (e.g., wheelchair or bedside chair)?: Total Help needed to walk in hospital room?: Total Help needed climbing 3-5 steps with a railing? : Total 6 Click Score: 8    End of Session   Activity Tolerance: Patient limited by pain Patient left: in bed;with call bell/phone within reach;with bed alarm set Nurse Communication: Mobility status PT Visit Diagnosis: Difficulty in walking, not elsewhere classified (R26.2);Pain Pain - Right/Left: Left Pain - part of body: Leg     Time: 0952-1009 PT Time Calculation (min) (ACUTE ONLY): 17 min  Charges:    $Therapeutic Activity: 8-22 mins PT General Charges $$ ACUTE PT VISIT: 1 Visit                     Johnny Bridge, PT Acute Rehab    Jacqualyn Posey 01/15/2023, 10:51 AM

## 2023-01-16 DIAGNOSIS — T148XXA Other injury of unspecified body region, initial encounter: Secondary | ICD-10-CM

## 2023-01-16 MED ORDER — HYDROMORPHONE HCL 1 MG/ML IJ SOLN
0.5000 mg | INTRAMUSCULAR | Status: DC | PRN
  Administered 2023-01-16 – 2023-01-17 (×2): 0.5 mg via INTRAVENOUS
  Filled 2023-01-16 (×2): qty 0.5

## 2023-01-16 MED ORDER — OXYCODONE HCL 5 MG PO TABS
10.0000 mg | ORAL_TABLET | ORAL | Status: DC | PRN
  Administered 2023-01-16 – 2023-01-17 (×7): 10 mg via ORAL
  Filled 2023-01-16 (×7): qty 2

## 2023-01-16 NOTE — Progress Notes (Signed)
PHYSICAL THERAPY 16::00  Attempted to see pt this afternoon however she was unable to participate due to pain.  She stated she called for pain meds "a while ago" and has had no response.  Per chart review, her last pain meds where given at 11:33.  Notified RN and will attempt to see tomorrow am.  Felecia Shelling  PTA Acute  Rehabilitation Services Office M-F          [phone]

## 2023-01-16 NOTE — TOC Progression Note (Signed)
Transition of Care [facility]) - Progression Note    Patient Details  Name: Christina Howell MRN: [id number] Date of Birth: 1955/12/28  [facility] CM/SW Contact  Amada Jupiter, LCSW Phone Number: 01/16/2023, 12:40 PM  Clinical Narrative:    Alerted by RN that pt and s/o requesting to speak with TOC.  Met and introduced myself and s/o asking if I was the person who "came by this morning and said she needed to get up and get ready for discharge."  Explained this was not me who came by but he appeared to think it was someone from [facility] dept.  Reviewed role of TOC CSW/ CM to assist with discharge, however, do not determine when someone is ready for discharge. As we spoke, pt notably tearful and notes frustrations with pain management.  Pt and s/o very clearly state that they do not feel pt is ready for dc and, again, explained that TOC will follow along and assist if there are follow up needs for dc but will not be making decision on pain management or dc readiness.    Expected Discharge Plan: Home/Self Care Barriers to Discharge: Continued Medical Work up  Expected Discharge Plan and Services In-house Referral: Clinical Social Work   Post Acute Care Choice: Durable Medical Equipment Living arrangements for the past 2 months: Single Family Home                 DME Arranged: Walker rolling DME Agency: [employer] Date DME Agency Contacted: 01/14/23 Time DME Agency Contacted: 1431 Representative spoke with at DME Agency: Selena Batten             Social Determinants of Health (SDOH) Interventions SDOH Screenings   Food Insecurity: No Food Insecurity (01/13/2023)  Housing: Low Risk  (01/13/2023)  Transportation Needs: No Transportation Needs (01/13/2023)  Utilities: Not At Risk (01/13/2023)  Social Connections: Unknown (07/12/2021)   Received from [facility], [facility]  Tobacco Use: Low Risk  (01/13/2023)    Readmission Risk Interventions     No data to display

## 2023-01-16 NOTE — Progress Notes (Signed)
PROGRESS NOTE    Christina Howell  ZOX:[id number] DOB: 01-20-1956 DOA: 01/12/2023 PCP: [name], MD   Brief Narrative:  This 67 y.o. female with PMH significant of acquired hypothyroidism, GERD, who presented in the ED with acute left hip pain after ground-level mechanical , Patient has been complaining of acute left hip pain after falling while bowling. Initial x-ray workup was negative in the emergency department but due to intractable pain and difficulty with ambulation , Patient was admitted for further evaluation. After thorough evaluation MRI imaging reveals complete avulsion of the left hamstring tendon with 3 to 4 cm of tendon retraction.  Case was discussed with Dr. [name] with orthopedic surgery who recommended crutches, weightbearing as tolerated and outpatient evaluation by orthopedic surgery in 1 week.   Opiate-based analgesics have been used due to patient substantial associated pain.  Assessment & Plan:   Active Problems:   Hypokalemia   Acquired hypothyroidism   Urinary incontinence   GERD (gastroesophageal reflux disease)   Avulsion of hamstring muscle, left, initial encounter   Fall from slipping, initial encounter   Avulsion of hamstring muscle, left, initial encounter: Dr.Daldorf with orthopedic surgery has evaluated the patient. Plan is for titration of opiate based analgesics and slow gradual attempts to get the patient out of bed using a rolling walker. Patient is then to follow-up with Dr. [name] in orthopedic clinic in 10 days It is likely that conservative management will be pursued. Patient still complains about severe pain, she has not get out of bed yet.  Fall from slipping, initial encounter Patient slipped while bowling resulting in injury above.   Continue pain control as needed.  Hypokalemia: Replaced. Continue to monitor.  Acquired hypothyroidism: Continuing levothyroxine.  Urinary incontinence: Continue Myrbetriq.  GERD  (gastroesophageal reflux disease): Continue Protonix.   DVT prophylaxis: Lovenox Code Status: Full code Family Communication: Spouse at bed side. Disposition Plan:   Status is: Inpatient Remains inpatient appropriate because:Admitted status post mechanical fall with avulsion fracture of hamstring muscle.  Orthopedics recommended conservative management. Needs better pain control     Consultants:  Orthopaedics  Procedures: Antimicrobials:  Anti-infectives (From admission, onward)    None      Subjective: Patient was seen and examined at bedside.  Overnight events noted. She continues to report severe pain in the left hamstring area.  States she is not ready for discharge yet.  Objective: Vitals:   01/15/23 1155 01/15/23 1942 01/16/23 0603 01/16/23 1230  BP: 134/68 (!) 142/84 130/79 119/67  Pulse: 76 72 72 82  Resp: 18 19 20 20   Temp: 98 F (36.7 C) (!) 97.5 F (36.4 C) 98 F (36.7 C) 98.1 F (36.7 C)  TempSrc: Oral Oral Oral   SpO2: 100% 100% 98% 100%  Weight:      Height:       No intake or output data in the 24 hours ending 01/16/23 1505  Filed Weights   01/13/23 0421 01/14/23 0642 01/15/23 0521  Weight: 80.1 kg 81.7 kg 81.6 kg    Examination:  General exam: Appears calm and comfortable , deconditioned, not in any acute distress. Respiratory system: CTA bilaterally. Respiratory effort normal.  RR 14 Cardiovascular system: S1 & S2 heard, RRR. No JVD, murmurs, rubs, gallops or clicks.  Gastrointestinal system: Abdomen is non distended, soft and non tender.  Normal bowel sounds heard. Central nervous system: Alert and oriented x 3. No focal neurological deficits. Extremities: Left hamstring tenderness+, ROM : Limited. Skin: No rashes, lesions or ulcers  Psychiatry: Judgement and insight appear normal. Mood & affect appropriate.     Data Reviewed: I have personally reviewed following labs and imaging studies  CBC: Recent Labs  Lab 01/12/23 2110  01/13/23 0509 01/14/23 0728 01/15/23 0858  WBC 7.0 6.3 4.7 4.9  NEUTROABS 4.5 3.9  --   --   HGB 11.6* 12.7 13.4 13.2  HCT 34.4* 38.0 38.5 37.7  MCV 92.5 91.3 91.4 91.1  PLT 163 181 154 163   Basic Metabolic Panel: Recent Labs  Lab 01/12/23 2110 01/13/23 0509 01/14/23 0728  NA 139 133* 137  K 3.2* 3.6 4.1  CL 109 102 105  CO2 23 24 25   GLUCOSE 103* 101* 97  BUN 14 13 17   CREATININE 0.53 0.61 0.58  CALCIUM 8.0* 8.5* 8.6*  MG 2.1 2.1  --    GFR: Estimated Creatinine Clearance: 77.5 mL/min (by C-G formula based on SCr of 0.58 mg/dL). Liver Function Tests: Recent Labs  Lab 01/13/23 0509  AST 20  ALT 20  ALKPHOS 44  BILITOT 1.1  PROT 5.9*  ALBUMIN 3.8   No results for input(s): "LIPASE", "AMYLASE" in the last 168 hours. No results for input(s): "AMMONIA" in the last 168 hours. Coagulation Profile: No results for input(s): "INR", "PROTIME" in the last 168 hours. Cardiac Enzymes: No results for input(s): "CKTOTAL", "CKMB", "CKMBINDEX", "TROPONINI" in the last 168 hours. BNP (last 3 results) No results for input(s): "PROBNP" in the last 8760 hours. HbA1C: No results for input(s): "HGBA1C" in the last 72 hours. CBG: No results for input(s): "GLUCAP" in the last 168 hours. Lipid Profile: No results for input(s): "CHOL", "HDL", "LDLCALC", "TRIG", "CHOLHDL", "LDLDIRECT" in the last 72 hours. Thyroid Function Tests: No results for input(s): "TSH", "T4TOTAL", "FREET4", "T3FREE", "THYROIDAB" in the last 72 hours. Anemia Panel: No results for input(s): "VITAMINB12", "FOLATE", "FERRITIN", "TIBC", "IRON", "RETICCTPCT" in the last 72 hours. Sepsis Labs: No results for input(s): "PROCALCITON", "LATICACIDVEN" in the last 168 hours.  No results found for this or any previous visit (from the past 240 hour(s)).   Radiology Studies: No results found.  Scheduled Meds:  acetaminophen  650 mg Oral BID   levothyroxine  50 mcg Oral Q0600   lidocaine  1 patch Transdermal Q24H    mirabegron ER  50 mg Oral QHS   pantoprazole  20 mg Oral Daily   senna  1 tablet Oral QHS   Continuous Infusions:   LOS: 2 days    Time spent: 35 MINS    Willeen Niece, MD [facility]   If 7PM-7AM, please contact night-coverage

## 2023-01-16 NOTE — Plan of Care (Signed)

## 2023-01-16 NOTE — Progress Notes (Signed)
Based on patient's partner request: A secure message was sent to  [facility], LCSW, in order to come and see the patient and her partner at the patient's room at her earliest possible.

## 2023-01-17 DIAGNOSIS — W010XXA Fall on same level from slipping, tripping and stumbling without subsequent striking against object, initial encounter: Secondary | ICD-10-CM | POA: Diagnosis not present

## 2023-01-17 DIAGNOSIS — S76392A Other specified injury of muscle, fascia and tendon of the posterior muscle group at thigh level, left thigh, initial encounter: Secondary | ICD-10-CM | POA: Diagnosis not present

## 2023-01-17 DIAGNOSIS — R32 Unspecified urinary incontinence: Secondary | ICD-10-CM | POA: Diagnosis not present

## 2023-01-17 MED ORDER — ONDANSETRON HCL 4 MG PO TABS
4.0000 mg | ORAL_TABLET | Freq: Three times a day (TID) | ORAL | Status: DC | PRN
  Administered 2023-01-17: 4 mg via ORAL
  Filled 2023-01-17: qty 1

## 2023-01-17 MED ORDER — BISACODYL 10 MG RE SUPP
10.0000 mg | Freq: Every day | RECTAL | Status: AC | PRN
  Administered 2023-01-17 – 2023-01-21 (×2): 10 mg via RECTAL
  Filled 2023-01-17 (×2): qty 1

## 2023-01-17 NOTE — Progress Notes (Signed)
Physical Therapy Treatment Patient Details Name: Christina Howell MRN: [id number] DOB: February 10, 1956 Today's Date: 01/17/2023   History of Present Illness Patient is a 67 year old female slipped and fell while bowling with family. LLE went into extreme hip flexion with knee extension and landed on Left hip. x rays are negative however pt cannot straighten L LE withotu sever pain in posterior L upper thigh ( hamstring) area. MRI (+) complete avulsion L hamstring tendon-orthopedics consulted-recommend non-op mgmt, f/u as OP, WBAT.    PT Comments  PM session General Comments: AxO x 3 very pleasant Lady and willing but limited by pain with activity despite having pain meds prior to session.  10mg  OXY 45 min prior General bed mobility comments: Pt was able to roll to her right and even bridge to remove pillow and purewick with trace c/o pain.  Pt was able to sit EOB briefly with MAX VC's to keep LEFT LE flexed and "lean back" to avoid stressing LEFT Hamstring during seated hip flexion.   General transfer comment: Pt had to immediately stand due to increased pain. Pt stood at bedside + 2 Min Assist with walker all while trying to hold her LEFT LE up off floor at nearly 60 degrees. Max VC's to lightly place LEFT foot/toe on floor. General Gait Details: VERY limted amb distance of 1 foot away from the bed then turn around to amb another foot back to bed.  "I can't do it" due to increased pain and anxiety with c/o nausea following.  Pt was unable to even 25% WB thru LEFT LE with excessive lean on walker. Required increased assist back to bed to fully support LEFT LE.  Once in bed, pt was able to self roll onto her RIGHT.  Pillows placed for comfort and ICE packs applied. Reported to RN Pt's request for Nausea Medication    If plan is discharge home, recommend the following: A lot of help with walking and/or transfers;A lot of help with bathing/dressing/bathroom;Assistance with cooking/housework;Help with stairs or  ramp for entrance   Can travel by private vehicle        Equipment Recommendations  Rolling walker (2 wheels)    Recommendations for Other Services       Precautions / Restrictions Precautions Precautions: Fall Precaution Comments: RICE along with trans/gait with AD to tolerance until f/u Ortho MD per LPT Restrictions Weight Bearing Restrictions: No LLE Weight Bearing: Weight bearing as tolerated     Mobility  Bed Mobility Overal bed mobility: Needs Assistance Bed Mobility: Rolling, Supine to Sit, Sit to Supine Rolling: Modified independent (Device/Increase time)   Supine to sit: Supervision, Contact guard Sit to supine: Mod assist   General bed mobility comments: Pt was able to roll to her right and even bridge to remove pillow and purewick with trace c/o pain.  Pt was able to sit EOB briefly with MAX VC's to keep LEFT LE flexed and "lean back" to avoid stressing LEFT Hamstring during seated hip flexion.  Required increased assist back to bed to fully support LEFT LE.  Once in bed, pt was able to self roll onto her RIGHT.  Pillows placed for comfort and ICE packs applied.    Transfers                   General transfer comment: Pt had to immediately stand due to increased pain. Pt stood at bedside + 2 Min Assist with walker all while trying to hold her LEFT LE up  off floor at nearly 60 degrees. Max VC's to lightly place LEFT foot/toe on floor.    Ambulation/Gait Ambulation/Gait assistance: Contact guard assist, Min assist Gait Distance (Feet): 2 Feet Assistive device: Rolling walker (2 wheels) Gait Pattern/deviations: Step-to pattern, Decreased stance time - left Gait velocity: decreased     General Gait Details: VERY limted amb distance of 1 foot away from the bed then turn around to amb another foot back to bed.  "I can't do it" due to increased pain and anxiety with c/o nausea following.  Pt was unable to even 25% WB thru LEFT LE with excessive lean on  walker.   Stairs             Wheelchair Mobility     Tilt Bed    Modified Rankin (Stroke Patients Only)       Balance                                            Cognition Arousal: Alert Behavior During Therapy: WFL for tasks assessed/performed Overall Cognitive Status: Within Functional Limits for tasks assessed                                 General Comments: AxO x 3 very pleasant Lady and willing but limited by pain with activity despite having pain meds prior to session.        Exercises      General Comments        Pertinent Vitals/Pain Pain Assessment Pain Assessment: 0-10 Pain Score: 10-Worst pain ever Pain Location: L proximal/middle/distal posterior thigh (hamstrings) Pre medicated 10 OXY 45 min prior Pain Descriptors / Indicators: Constant, Crying, Discomfort, Restless, Grimacing Pain Intervention(s): Monitored during session, Premedicated before session, Repositioned, Ice applied    Home Living                          Prior Function            PT Goals (current goals can now be found in the care plan section) Progress towards PT goals: Progressing toward goals    Frequency    Min 1X/week      PT Plan      Co-evaluation              AM-PAC PT "6 Clicks" Mobility   Outcome Measure  Help needed turning from your back to your side while in a flat bed without using bedrails?: None Help needed moving from lying on your back to sitting on the side of a flat bed without using bedrails?: None Help needed moving to and from a bed to a chair (including a wheelchair)?: Total Help needed standing up from a chair using your arms (e.g., wheelchair or bedside chair)?: Total Help needed to walk in hospital room?: Total Help needed climbing 3-5 steps with a railing? : Total 6 Click Score: 12    End of Session Equipment Utilized During Treatment: Gait belt Activity Tolerance: Patient limited  by pain Patient left: in bed;with call bell/phone within reach;with bed alarm set;with family/visitor present Nurse Communication: Mobility status PT Visit Diagnosis: Difficulty in walking, not elsewhere classified (R26.2);Pain Pain - Right/Left: Left Pain - part of body: Leg     Time: 0932-3557 PT Time Calculation (min) (ACUTE ONLY):  17 min  Charges:    $Therapeutic Activity: 8-22 mins PT General Charges $$ ACUTE PT VISIT: 1 Visit                     Felecia Shelling  PTA Acute  Rehabilitation Services Office M-F          [phone]

## 2023-01-17 NOTE — Progress Notes (Addendum)
Physical Therapy Treatment Patient Details Name: Christina Howell MRN: [id number] DOB: 11-08-1955 Today's Date: 01/17/2023   History of Present Illness Patient is a 67 year old female slipped and fell while bowling with family. LLE went into extreme hip flexion with knee extension and landed on Left hip. x rays are negative however pt cannot straighten L LE withotu sever pain in posterior L upper thigh ( hamstring) area. MRI (+) complete avulsion L hamstring tendon-orthopedics consulted-recommend non-op mgmt, f/u as OP, WBAT.    PT Comments  Prior to session, I consulted with evaluating Physical Therapy regarding Pt's activity level and suggestions to progress Pt's mobility.  She emphasized PAIN control, RICE(Rest/Ice/Compression/Elevation)  and  avoid MVTS that elongate the Hamstrings (hip flexion and knee extension).  Also, limited exercises to ankle pumps only until pt's f/u appointment with Ortho.  "See in 10 days" per chart review on 01/14/23. Pt is WBAT with walker but has yet to attempt.     General Comments: AxO x 3 very pleasant Lady and willing but limited by pain with activity despite having pain meds prior to session. Attempted sitting EOB was unsuccessful due to intense 10/10 pain.  Pain was pre medicated prior to session. See below.    If plan is discharge home, recommend the following: A lot of help with walking and/or transfers;A lot of help with bathing/dressing/bathroom;Assistance with cooking/housework;Help with stairs or ramp for entrance   Can travel by private vehicle        Equipment Recommendations  Rolling walker (2 wheels)    Recommendations for Other Services       Precautions / Restrictions Precautions Precautions: Fall Precaution Comments: RICE along with trans/gait with AD to tolerance until f/u Ortho MD per LPT Restrictions Weight Bearing Restrictions: No LLE Weight Bearing: Weight bearing as tolerated     Mobility  Bed Mobility Overal bed mobility: Needs  Assistance Bed Mobility: Rolling Rolling: Modified independent (Device/Increase time)   Supine to sit: Max assist     General bed mobility comments: patient was able to roll herself to R side in bed with pillow between her knees.  Attempted sitting EOB was unsuccessful due to increased 10/10 pain 1/2 way through task.  Instructed to "lean back" upper body and keep B knees flexed/relaxed to avoid elingating her Hamstrings.  Instructed to avoid hip flexion along with knee extension.  Pt barely cleared B heels off bed when pain intensified at proximal posterior LEFT thigh.  "I got to lay back down".  Assisted to RIGHT sidelying with pillow between bent knees and positioned to comfort.  Applied ICE.    Transfers                   General transfer comment: attempted however unable to complete due to 10/10 pain    Ambulation/Gait                   Stairs             Wheelchair Mobility     Tilt Bed    Modified Rankin (Stroke Patients Only)       Balance                                            Cognition Arousal: Alert Behavior During Therapy: WFL for tasks assessed/performed Overall Cognitive Status: Within Functional Limits for tasks assessed  General Comments: AxO x 3 very pleasant Lady and willing but limited by pain with activity despite having pain meds prior to session.        Exercises      General Comments        Pertinent Vitals/Pain Pain Assessment Pain Assessment: 0-10 Pain Score: 10-Worst pain ever Pain Location: L proximal/middle/distal posterior thigh (hamstrings) Pain Descriptors / Indicators: Constant, Crying, Discomfort, Restless, Grimacing Pain Intervention(s): Monitored during session, Premedicated before session, Repositioned, Ice applied    Home Living                          Prior Function            PT Goals (current goals can now be  found in the care plan section) Progress towards PT goals: Progressing toward goals    Frequency    Min 1X/week      PT Plan      Co-evaluation              AM-PAC PT "6 Clicks" Mobility   Outcome Measure  Help needed turning from your back to your side while in a flat bed without using bedrails?: None Help needed moving from lying on your back to sitting on the side of a flat bed without using bedrails?: None Help needed moving to and from a bed to a chair (including a wheelchair)?: Total Help needed standing up from a chair using your arms (e.g., wheelchair or bedside chair)?: Total Help needed to walk in hospital room?: Total Help needed climbing 3-5 steps with a railing? : Total 6 Click Score: 12    End of Session   Activity Tolerance: Patient limited by pain Patient left: in bed;with call bell/phone within reach;with bed alarm set;with family/visitor present Nurse Communication: Mobility status PT Visit Diagnosis: Difficulty in walking, not elsewhere classified (R26.2);Pain Pain - Right/Left: Left Pain - part of body: Leg     Time: 1000-1026 PT Time Calculation (min) (ACUTE ONLY): 26 min  Charges:    $Therapeutic Activity: 23-37 mins PT General Charges $$ ACUTE PT VISIT: 1 Visit                     Felecia Shelling  PTA Acute  Rehabilitation Services Office M-F          [phone]

## 2023-01-17 NOTE — Plan of Care (Signed)
  Problem: Education: Goal: Knowledge of General Education information will improve Description: Including pain rating scale, medication(s)/side effects and non-pharmacologic comfort measures Outcome: Progressing   Problem: Health Behavior/Discharge Planning: Goal: Ability to manage health-related needs will improve Outcome: Progressing   Problem: Clinical Measurements: Goal: Will remain free from infection Outcome: Progressing   Problem: Activity: Goal: Risk for activity intolerance will decrease Outcome: Progressing   Problem: Nutrition: Goal: Adequate nutrition will be maintained Outcome: Progressing   Problem: Pain Management: Goal: General experience of comfort will improve Outcome: Progressing   Problem: Safety: Goal: Ability to remain free from injury will improve Outcome: Progressing   Problem: Skin Integrity: Goal: Risk for impaired skin integrity will decrease Outcome: Progressing

## 2023-01-17 NOTE — Progress Notes (Signed)
PROGRESS NOTE    Christina Howell  ZOX:[id number] DOB: [birth year] DOA: 01/12/2023 PCP: [name], MD   Brief Narrative:  This 67 y.o. female with PMH significant of acquired hypothyroidism, GERD, who presented in the ED with acute left hip pain after ground-level mechanical , Patient has been complaining of acute left hip pain after falling while bowling. Initial x-ray workup was negative in the emergency department but due to intractable pain and difficulty with ambulation , Patient was admitted for further evaluation. After thorough evaluation MRI imaging reveals complete avulsion of the left hamstring tendon with 3 to 4 cm of tendon retraction.  Case was discussed with Dr. [name] with orthopedic surgery who recommended crutches, weightbearing as tolerated and outpatient evaluation by orthopedic surgery in 1 week.   Opiate-based analgesics have been used due to patient substantial associated pain.    Assessment & Plan:   Active Problems:   Hypokalemia   Acquired hypothyroidism   Urinary incontinence   GERD (gastroesophageal reflux disease)   Avulsion of hamstring muscle, left, initial encounter   Fall from slipping, initial encounter  Fall from slipping leading to avulsion of hamstring muscle, left, initial encounter: Dr.Daldorf with orthopedic surgery has evaluated the patient. Plan is for titration of opiate based analgesics and slow gradual attempts to get the patient out of bed using a rolling walker. Patient is then to follow-up with Dr. [name] in orthopedic clinic in 10 days It is likely that conservative management will be pursued. She has been on Flexeril 3 times daily and oxycodone 10 mg every 3 hours as needed and she has been receiving that every 3 hours literally and yet patient still complains about severe pain, she has not get out of bed yet.  Does not feel ready to go home.   Hypokalemia: Resolved.   Acquired hypothyroidism: Continuing levothyroxine.   Urinary  incontinence: Continue Myrbetriq.   GERD (gastroesophageal reflux disease): Continue Protonix.  DVT prophylaxis: SCDs Start: 01/12/23 2209   Code Status: Full Code  Family Communication:  None present at bedside.  Plan of care discussed with patient in length and he/she verbalized understanding and agreed with it.  Status is: Inpatient Remains inpatient appropriate because: Still with significant pain to the left lower extremity, unable to work with PT.   Estimated body mass index is 27.35 kg/m as calculated from the following:   Height as of this encounter: 5\' 8"  (1.727 m).   Weight as of this encounter: 81.6 kg.    Nutritional Assessment: Body mass index is 27.35 kg/m.Marland Kitchen Seen by dietician.  I agree with the assessment and plan as outlined below: Nutrition Status:        . Skin Assessment: I have examined the patient's skin and I agree with the wound assessment as performed by the wound care RN as outlined below:    Consultants:  Orthopedics  Procedures:  None  Antimicrobials:  Anti-infectives (From admission, onward)    None         Subjective: Patient seen and examined.  Still complains of the severe pain in the left lower extremity.  Objective: Vitals:   01/16/23 1230 01/16/23 2005 01/17/23 0700 01/17/23 1157  BP: 119/67 (!) 132/55 135/75 (!) 135/58  Pulse: 82 80 79 86  Resp: 20 15 16 18   Temp: 98.1 F (36.7 C) 97.8 F (36.6 C) 98 F (36.7 C) 98.6 F (37 C)  TempSrc:  Oral Oral Oral  SpO2: 100% 97% 98% 96%  Weight:  Height:        Intake/Output Summary (Last 24 hours) at 01/17/2023 1357 Last data filed at 01/16/2023 1700 Gross per 24 hour  Intake --  Output 350 ml  Net -350 ml   Filed Weights   01/13/23 0421 01/14/23 0642 01/15/23 0521  Weight: 80.1 kg 81.7 kg 81.6 kg    Examination:  General exam: Appears calm and comfortable  Respiratory system: Clear to auscultation. Respiratory effort normal. Cardiovascular system: S1 &  S2 heard, RRR. No JVD, murmurs, rubs, gallops or clicks. No pedal edema. Gastrointestinal system: Abdomen is nondistended, soft and nontender. No organomegaly or masses felt. Normal bowel sounds heard. Central nervous system: Alert and oriented. No focal neurological deficits. Extremities: Limited range of motion of the left lower extremity due to pain. Skin: No rashes, lesions or ulcers Psychiatry: Judgement and insight appear normal. Mood & affect appropriate.    Data Reviewed: I have personally reviewed following labs and imaging studies  CBC: Recent Labs  Lab 01/12/23 2110 01/13/23 0509 01/14/23 0728 01/15/23 0858  WBC 7.0 6.3 4.7 4.9  NEUTROABS 4.5 3.9  --   --   HGB 11.6* 12.7 13.4 13.2  HCT 34.4* 38.0 38.5 37.7  MCV 92.5 91.3 91.4 91.1  PLT 163 181 154 163   Basic Metabolic Panel: Recent Labs  Lab 01/12/23 2110 01/13/23 0509 01/14/23 0728  NA 139 133* 137  K 3.2* 3.6 4.1  CL 109 102 105  CO2 23 24 25   GLUCOSE 103* 101* 97  BUN 14 13 17   CREATININE 0.53 0.61 0.58  CALCIUM 8.0* 8.5* 8.6*  MG 2.1 2.1  --    GFR: Estimated Creatinine Clearance: 77.5 mL/min (by C-G formula based on SCr of 0.58 mg/dL). Liver Function Tests: Recent Labs  Lab 01/13/23 0509  AST 20  ALT 20  ALKPHOS 44  BILITOT 1.1  PROT 5.9*  ALBUMIN 3.8   No results for input(s): "LIPASE", "AMYLASE" in the last 168 hours. No results for input(s): "AMMONIA" in the last 168 hours. Coagulation Profile: No results for input(s): "INR", "PROTIME" in the last 168 hours. Cardiac Enzymes: No results for input(s): "CKTOTAL", "CKMB", "CKMBINDEX", "TROPONINI" in the last 168 hours. BNP (last 3 results) No results for input(s): "PROBNP" in the last 8760 hours. HbA1C: No results for input(s): "HGBA1C" in the last 72 hours. CBG: No results for input(s): "GLUCAP" in the last 168 hours. Lipid Profile: No results for input(s): "CHOL", "HDL", "LDLCALC", "TRIG", "CHOLHDL", "LDLDIRECT" in the last 72  hours. Thyroid Function Tests: No results for input(s): "TSH", "T4TOTAL", "FREET4", "T3FREE", "THYROIDAB" in the last 72 hours. Anemia Panel: No results for input(s): "VITAMINB12", "FOLATE", "FERRITIN", "TIBC", "IRON", "RETICCTPCT" in the last 72 hours. Sepsis Labs: No results for input(s): "PROCALCITON", "LATICACIDVEN" in the last 168 hours.  No results found for this or any previous visit (from the past 240 hour(s)).   Radiology Studies: No results found.  Scheduled Meds:  acetaminophen  650 mg Oral BID   levothyroxine  50 mcg Oral Q0600   lidocaine  1 patch Transdermal Q24H   mirabegron ER  50 mg Oral QHS   pantoprazole  20 mg Oral Daily   senna  1 tablet Oral QHS   Continuous Infusions:   LOS: 3 days   Hughie Closs, MD [facility]  01/17/2023, 1:57 PM   *Please note that this is a verbal dictation therefore any spelling or grammatical errors are due to the "Dragon Medical One" system interpretation.  Please page via Amion  and do not message via secure chat for urgent patient care matters. Secure chat can be used for non urgent patient care matters.  How to contact the [facility] Attending or Consulting provider 7A - 7P or covering provider during after hours 7P -7A, for this patient?  Check the care team in [facility] and look for a) attending/consulting TRH provider listed and b) the [facility] team listed. Page or secure chat 7A-7P. Log into [email or web address] and use [place]'s universal password to access. If you do not have the password, please contact the hospital operator. Locate the [facility] provider you are looking for under [facility] and page to a number that you can be directly reached. If you still have difficulty reaching the provider, please page the [facility] (Director on Call) for the Hospitalists listed on amion for assistance.

## 2023-01-18 DIAGNOSIS — W010XXA Fall on same level from slipping, tripping and stumbling without subsequent striking against object, initial encounter: Secondary | ICD-10-CM | POA: Diagnosis not present

## 2023-01-18 DIAGNOSIS — S76392A Other specified injury of muscle, fascia and tendon of the posterior muscle group at thigh level, left thigh, initial encounter: Secondary | ICD-10-CM | POA: Diagnosis not present

## 2023-01-18 LAB — BASIC METABOLIC PANEL
Anion gap: 8 (ref 5–15)
BUN: 15 mg/dL (ref 8–23)
CO2: 24 mmol/L (ref 22–32)
Calcium: 8.3 mg/dL — ABNORMAL LOW (ref 8.9–10.3)
Chloride: 99 mmol/L (ref 98–111)
Creatinine, Ser: 0.65 mg/dL (ref 0.44–1.00)
GFR, Estimated: >60 mL/min (ref >60)
Glucose, Bld: 123 mg/dL — ABNORMAL HIGH (ref 70–99)
Potassium: 4 mmol/L (ref 3.5–5.1)
Sodium: 131 mmol/L — ABNORMAL LOW (ref 135–145)

## 2023-01-18 MED ORDER — HYDROMORPHONE HCL 1 MG/ML IJ SOLN
0.5000 mg | INTRAMUSCULAR | Status: DC | PRN
  Administered 2023-01-18 – 2023-01-19 (×3): 0.5 mg via INTRAVENOUS
  Filled 2023-01-18 (×3): qty 0.5

## 2023-01-18 MED ORDER — OXYCODONE HCL 5 MG PO TABS
10.0000 mg | ORAL_TABLET | ORAL | Status: DC | PRN
  Administered 2023-01-18 – 2023-01-19 (×6): 10 mg via ORAL
  Filled 2023-01-18 (×6): qty 2

## 2023-01-18 NOTE — NC FL2 (Signed)
[place] MEDICAID FL2 LEVEL OF CARE FORM     IDENTIFICATION  Patient Name: Christina Howell Birthdate: 05-Apr-1955 Sex: female Admission Date (Current Location): 01/12/2023  [facility] and [place] Number:  Producer, television/film/video and Address:  [facility],  [address] [place], [place] [address]      Provider Number: [id number]  Attending Physician Name and Address:  Hughie Closs, MD  Relative Name and Phone Number:  Annamaria Helling (friend) [phone]    Current Level of Care: Hospital Recommended Level of Care: Skilled Nursing Facility Prior Approval Number:    Date Approved/Denied:   PASRR Number: [id number]  Discharge Plan: SNF    Current Diagnoses: Patient Active Problem List   Diagnosis Date Noted   Fall from slipping, initial encounter 01/15/2023   Avulsion of hamstring muscle, left, initial encounter 01/14/2023   Hypokalemia 01/13/2023   Acquired hypothyroidism 01/13/2023   Urinary incontinence 01/13/2023   GERD (gastroesophageal reflux disease) 01/13/2023   Acute pain of right knee 01/29/2018   Throat congestion 09/07/2010   Chest tightness 07/22/2010    Orientation RESPIRATION BLADDER Height & Weight     Self, Time, Situation  Normal Continent Weight: 83 kg Height:  5\' 8"  (172.7 cm)  BEHAVIORAL SYMPTOMS/MOOD NEUROLOGICAL BOWEL NUTRITION STATUS      Continent Diet (regular)  AMBULATORY STATUS COMMUNICATION OF NEEDS Skin   Extensive Assist Verbally Bruising (ecchymosis and  erythema to bilateral arms and legs)                       Personal Care Assistance Level of Assistance  Bathing, Feeding, Dressing Bathing Assistance: Maximum assistance Feeding assistance: Independent Dressing Assistance: Maximum assistance     Functional Limitations Info  Sight, Hearing, Speech Sight Info: Impaired Hearing Info: Adequate Speech Info: Adequate    SPECIAL CARE FACTORS FREQUENCY  PT (By licensed PT), OT (By licensed OT)     PT Frequency:  5x/week OT Frequency: 5x/week            Contractures Contractures Info: Present    Additional Factors Info  Code Status, Allergies Code Status Info: Full Allergies Info: Diphenhydramine Hcl           Current Medications (01/18/2023):  This is the current hospital active medication list Current Facility-Administered Medications  Medication Dose Route Frequency Provider Last Rate Last Admin   acetaminophen (TYLENOL) tablet 650 mg  650 mg Oral Q6H PRN Howerter, Justin B, DO   650 mg at 01/18/23 8469   Or   acetaminophen (TYLENOL) suppository 650 mg  650 mg Rectal Q6H PRN Howerter, Justin B, DO       acetaminophen (TYLENOL) tablet 650 mg  650 mg Oral BID Marinda Elk, MD   650 mg at 01/17/23 2153   bisacodyl (DULCOLAX) suppository 10 mg  10 mg Rectal Daily PRN Anthoney Harada, NP   10 mg at 01/17/23 2313   cyclobenzaprine (FLEXERIL) tablet 10 mg  10 mg Oral TID PRN Howerter, Justin B, DO   10 mg at 01/18/23 1356   oxyCODONE (Oxy IR/ROXICODONE) immediate release tablet 10 mg  10 mg Oral Q3H PRN Anthoney Harada, NP   10 mg at 01/18/23 1212   Or   HYDROmorphone (DILAUDID) injection 0.5 mg  0.5 mg Intravenous Q3H PRN Anthoney Harada, NP   0.5 mg at 01/18/23 0534   levothyroxine (SYNTHROID) tablet 50 mcg  50 mcg Oral Q0600 Howerter, Justin B, DO   50 mcg at 01/18/23 [phone]  lidocaine (LIDODERM) 5 % 1 patch  1 patch Transdermal Q24H Howerter, Justin B, DO   1 patch at 01/18/23 0022   melatonin tablet 3 mg  3 mg Oral QHS PRN Howerter, Justin B, DO       mirabegron ER (MYRBETRIQ) tablet 50 mg  50 mg Oral QHS Howerter, Justin B, DO   50 mg at 01/17/23 2155   naloxone [facility]) injection 0.4 mg  0.4 mg Intravenous PRN Howerter, Justin B, DO       ondansetron (ZOFRAN) injection 4 mg  4 mg Intravenous Q6H PRN Howerter, Justin B, DO   4 mg at 01/18/23 0643   ondansetron (ZOFRAN) tablet 4 mg  4 mg Oral Q8H PRN Hughie Closs, MD   4 mg at 01/17/23 1534   pantoprazole (PROTONIX) EC tablet 20  mg  20 mg Oral Daily Howerter, Justin B, DO   20 mg at 01/18/23 0839   polyethylene glycol (MIRALAX / GLYCOLAX) packet 17 g  17 g Oral Daily PRN Marinda Elk, MD   17 g at 01/17/23 1933   senna (SENOKOT) tablet 8.6 mg  1 tablet Oral QHS Marinda Elk, MD   8.6 mg at 01/17/23 2155     Discharge Medications: Please see discharge summary for a list of discharge medications.  Relevant Imaging Results:  Relevant Lab Results:   Additional Information SSN [id number]  Adrian Prows, RN

## 2023-01-18 NOTE — TOC Progression Note (Signed)
Transition of Care [facility]) - Progression Note    Patient Details  Name: Christina Howell MRN: [id number] Date of Birth: [birth year]  [facility] CM/SW Contact  Adrian Prows, RN Phone Number: 01/18/2023, 2:18 PM  Clinical Narrative:    Notified pt would like to d/c to SNF; spoke w/ pt in room; she agreed that she would like to go to SNF; explained SNF process; she does not have a facility preference; pt gave her physical address [address] [place], [place] [address]; obtained PASRR # [id number]; faxed out for bed offers; ins auth.   Expected Discharge Plan: Home/Self Care Barriers to Discharge: Continued Medical Work up  Expected Discharge Plan and Services In-house Referral: Clinical Social Work   Post Acute Care Choice: Durable Medical Equipment Living arrangements for the past 2 months: Single Family Home                 DME Arranged: Walker rolling DME Agency: [employer] Date DME Agency Contacted: 01/14/23 Time DME Agency Contacted: 1431 Representative spoke with at DME Agency: Selena Batten             Social Determinants of Health (SDOH) Interventions SDOH Screenings   Food Insecurity: No Food Insecurity (01/13/2023)  Housing: Low Risk  (01/13/2023)  Transportation Needs: No Transportation Needs (01/13/2023)  Utilities: Not At Risk (01/13/2023)  Social Connections: Unknown (07/12/2021)   Received from [facility]  Tobacco Use: Low Risk  (01/13/2023)    Readmission Risk Interventions     No data to display

## 2023-01-18 NOTE — Progress Notes (Signed)
   01/18/23 1300   PT - Assessment/Plan  Follow Up Recommendations Skilled nursing-short term rehab (<3 hours/day)  Can patient physically be transported by private vehicle No (great difficulty with sitting due to pain with hamstring tear posterior thigh, so may need transport via stretcher)    Updating recommendation due to patients impairments are resulting in great limitations with mobility with slow progress. PT will need longer time to heal and work with PT prior to being able to safely return home . We will continue to follow here in acute care as well and work on patient progress.   Thank you  Clois Dupes, PT, MPT Acute Rehabilitation Services Office: [phone] If a weekend: secure chat groups: [facility] PT, [facility] OT, [facility] SLP 01/18/2023

## 2023-01-18 NOTE — Progress Notes (Signed)
PROGRESS NOTE    Christina Howell  NWG:[id number] DOB: 1955/10/02 DOA: 01/12/2023 PCP: [name], MD   Brief Narrative:  This 67 y.o. female with PMH significant of acquired hypothyroidism, GERD, who presented in the ED with acute left hip pain after ground-level mechanical , Patient has been complaining of acute left hip pain after falling while bowling. Initial x-ray workup was negative in the emergency department but due to intractable pain and difficulty with ambulation , Patient was admitted for further evaluation. After thorough evaluation MRI imaging reveals complete avulsion of the left hamstring tendon with 3 to 4 cm of tendon retraction.  Case was discussed with Dr. [name] with orthopedic surgery who recommended crutches, weightbearing as tolerated and outpatient evaluation by orthopedic surgery in 1 week.   Opiate-based analgesics have been used due to patient substantial associated pain.    Assessment & Plan:   Active Problems:   Hypokalemia   Acquired hypothyroidism   Urinary incontinence   GERD (gastroesophageal reflux disease)   Avulsion of hamstring muscle, left, initial encounter   Fall from slipping, initial encounter  Fall from slipping leading to avulsion of hamstring muscle, left, initial encounter: Dr.Daldorf with orthopedic surgery has evaluated the patient. Plan is for titration of opiate based analgesics and slow gradual attempts to get the patient out of bed using a rolling walker. Patient is then to follow-up with Dr. [name] in orthopedic clinic in 10 days It is likely that conservative management will be pursued. She has been on Flexeril 3 times daily and oxycodone 10 mg every 3 hours as needed and she has been receiving that every 3 hours literally and yet patient still complains about severe pain, she has not get out of bed yet.  Does not feel ready to go home.  Has been seen by PT OT every day.  I do think that SNF might be a better option for her and  she is very interested in going there as well.  Discussed with PT.  They will assess her for that.   Hypokalemia: Resolved.   Acquired hypothyroidism: Continuing levothyroxine.   Urinary incontinence: Continue Myrbetriq.   GERD (gastroesophageal reflux disease): Continue Protonix.  DVT prophylaxis: SCDs Start: 01/12/23 2209   Code Status: Full Code  Family Communication:  None present at bedside.  Plan of care discussed with patient in length and he/she verbalized understanding and agreed with it.  Status is: Inpatient Remains inpatient appropriate because: Still with significant pain to the left lower extremity, PT to reassess   Estimated body mass index is 27.82 kg/m as calculated from the following:   Height as of this encounter: 5\' 8"  (1.727 m).   Weight as of this encounter: 83 kg.    Nutritional Assessment: Body mass index is 27.82 kg/m.Marland Kitchen Seen by dietician.  I agree with the assessment and plan as outlined below: Nutrition Status:        . Skin Assessment: I have examined the patient's skin and I agree with the wound assessment as performed by the wound care RN as outlined below:    Consultants:  Orthopedics  Procedures:  None  Antimicrobials:  Anti-infectives (From admission, onward)    None         Subjective: Seen and limited.  Complains of pain in the left lower extremity, very minimal improvement compared to yesterday.  No other complaint but lots of questions.  Objective: Vitals:   01/18/23 0448 01/18/23 0453 01/18/23 0533 01/18/23 1241  BP: (!) 131/44  Marland Kitchen)  136/54 117/64  Pulse: 96  94 79  Resp: 18  16 18   Temp: 98.4 F (36.9 C)   98 F (36.7 C)  TempSrc: Oral   Oral  SpO2: 94%  96% 93%  Weight:  83 kg    Height:        Intake/Output Summary (Last 24 hours) at 01/18/2023 1342 Last data filed at 01/18/2023 0110 Gross per 24 hour  Intake --  Output 700 ml  Net -700 ml   Filed Weights   01/14/23 0642 01/15/23 0521 01/18/23  0453  Weight: 81.7 kg 81.6 kg 83 kg    Examination:   General exam: Appears calm and comfortable  Respiratory system: Clear to auscultation. Respiratory effort normal. Cardiovascular system: S1 & S2 heard, RRR. No JVD, murmurs, rubs, gallops or clicks. No pedal edema. Gastrointestinal system: Abdomen is nondistended, soft and nontender. No organomegaly or masses felt. Normal bowel sounds heard. Central nervous system: Alert and oriented. No focal neurological deficits. Extremities: Limited range of motion of the left lower extremity due to pain. Skin: No rashes, lesions or ulcers Psychiatry: Judgement and insight appear normal. Mood & affect appropriate.    Data Reviewed: I have personally reviewed following labs and imaging studies  CBC: Recent Labs  Lab 01/12/23 2110 01/13/23 0509 01/14/23 0728 01/15/23 0858  WBC 7.0 6.3 4.7 4.9  NEUTROABS 4.5 3.9  --   --   HGB 11.6* 12.7 13.4 13.2  HCT 34.4* 38.0 38.5 37.7  MCV 92.5 91.3 91.4 91.1  PLT 163 181 154 163   Basic Metabolic Panel: Recent Labs  Lab 01/12/23 2110 01/13/23 0509 01/14/23 0728 01/18/23 0950  NA 139 133* 137 131*  K 3.2* 3.6 4.1 4.0  CL 109 102 105 99  CO2 23 24 25 24   GLUCOSE 103* 101* 97 123*  BUN 14 13 17 15   CREATININE 0.53 0.61 0.58 0.65  CALCIUM 8.0* 8.5* 8.6* 8.3*  MG 2.1 2.1  --   --    GFR: Estimated Creatinine Clearance: 78.1 mL/min (by C-G formula based on SCr of 0.65 mg/dL). Liver Function Tests: Recent Labs  Lab 01/13/23 0509  AST 20  ALT 20  ALKPHOS 44  BILITOT 1.1  PROT 5.9*  ALBUMIN 3.8   No results for input(s): "LIPASE", "AMYLASE" in the last 168 hours. No results for input(s): "AMMONIA" in the last 168 hours. Coagulation Profile: No results for input(s): "INR", "PROTIME" in the last 168 hours. Cardiac Enzymes: No results for input(s): "CKTOTAL", "CKMB", "CKMBINDEX", "TROPONINI" in the last 168 hours. BNP (last 3 results) No results for input(s): "PROBNP" in the last  8760 hours. HbA1C: No results for input(s): "HGBA1C" in the last 72 hours. CBG: No results for input(s): "GLUCAP" in the last 168 hours. Lipid Profile: No results for input(s): "CHOL", "HDL", "LDLCALC", "TRIG", "CHOLHDL", "LDLDIRECT" in the last 72 hours. Thyroid Function Tests: No results for input(s): "TSH", "T4TOTAL", "FREET4", "T3FREE", "THYROIDAB" in the last 72 hours. Anemia Panel: No results for input(s): "VITAMINB12", "FOLATE", "FERRITIN", "TIBC", "IRON", "RETICCTPCT" in the last 72 hours. Sepsis Labs: No results for input(s): "PROCALCITON", "LATICACIDVEN" in the last 168 hours.  No results found for this or any previous visit (from the past 240 hour(s)).   Radiology Studies: No results found.  Scheduled Meds:  acetaminophen  650 mg Oral BID   levothyroxine  50 mcg Oral Q0600   lidocaine  1 patch Transdermal Q24H   mirabegron ER  50 mg Oral QHS   pantoprazole  20 mg  Oral Daily   senna  1 tablet Oral QHS   Continuous Infusions:   LOS: 4 days   Hughie Closs, MD [facility]  01/18/2023, 1:42 PM   *Please note that this is a verbal dictation therefore any spelling or grammatical errors are due to the "Dragon Medical One" system interpretation.  Please page via Amion and do not message via secure chat for urgent patient care matters. Secure chat can be used for non urgent patient care matters.  How to contact the [facility] Attending or Consulting provider 7A - 7P or covering provider during after hours 7P -7A, for this patient?  Check the care team in [facility] and look for a) attending/consulting TRH provider listed and b) the [facility] team listed. Page or secure chat 7A-7P. Log into [email or web address] and use [place]'s universal password to access. If you do not have the password, please contact the hospital operator. Locate the [facility] provider you are looking for under [facility] and page to a number that you can be directly reached. If you still have difficulty reaching  the provider, please page the [facility] (Director on Call) for the Hospitalists listed on amion for assistance.

## 2023-01-18 NOTE — Plan of Care (Signed)

## 2023-01-18 NOTE — Progress Notes (Addendum)
Physical Therapy Treatment Patient Details Name: Christina Howell MRN: [id number] DOB: Aug 26, 1955 Today's Date: 01/18/2023   History of Present Illness Patient is a 67 year old female slipped and fell while bowling with family. LLE went into extreme hip flexion with knee extension and landed on Left hip. x rays are negative however pt cannot straighten L LE withotu sever pain in posterior L upper thigh ( hamstring) area. MRI (+) complete avulsion L hamstring tendon-orthopedics consulted-recommend non-op mgmt, f/u as OP, WBAT.    PT Comments  Pt very is limited by pain and fear of increasing pain or hurting L LE. Pt was seen in bed at start of treatment. Pt attempted rolling to R side and perform sidelyng to sit with pillow between LEs, but could not commit to movement due to significant pain. Pt was instructed to attempt rolling to L side, but the same thing occurred. After four attempts, we instructed the patient to quickly stand up on L side of bed, because pt hesitancy seemed to cause the patient more pain. Pt quickly performed supine -> sit -> stand with RW and mod assist +2. She was only able to stand for 5 seconds before jumping back into bed and crying out in pain. Pt is very limited in mobility due to pain and ANXIETY. Will attempt to see patient tomorrow. LPT recommends SNF.   If plan is discharge home, recommend the following: A lot of help with walking and/or transfers;A lot of help with bathing/dressing/bathroom;Assistance with cooking/housework;Help with stairs or ramp for entrance   Can travel by private vehicle     No  Equipment Recommendations  Rolling walker (2 wheels)    Recommendations for Other Services       Precautions / Restrictions Precautions Precautions: Fall Precaution Comments: RICE along with trans/gait with AD to tolerance until f/u Ortho MD per LPT Restrictions Weight Bearing Restrictions: Yes LLE Weight Bearing: Weight bearing as tolerated     Mobility  Bed  Mobility Overal bed mobility: Needs Assistance Bed Mobility: Rolling, Supine to Sit, Sit to Supine Rolling: Modified independent (Device/Increase time)   Supine to sit: Contact guard, Min assist, HOB elevated Sit to supine: Min assist, HOB elevated   General bed mobility comments: Pt could perform bed mobility without assistance, even performing bridge. Could move LE to EOB with pillow in between legs and knees flexed, but as soon as she would sit up, she would cry out in pain and quickly jump back into bed.    Transfers Overall transfer level: Needs assistance Equipment used: 2 person hand held assist, Rolling walker (2 wheels) Transfers: Sit to/from Stand Sit to Stand: Mod assist           General transfer comment: Pt was cued to quickly stand up since her being hesitant and slow seems to increase pain. Pt stood for 5 seconds before crying out and jumping back into bed.    Ambulation/Gait               General Gait Details: Could not walk due to increased anxiety and L LE pain   Stairs             Wheelchair Mobility     Tilt Bed    Modified Rankin (Stroke Patients Only)       Balance  Cognition Arousal: Alert Behavior During Therapy: WFL for tasks assessed/performed Overall Cognitive Status: Within Functional Limits for tasks assessed                                 General Comments: AxO x 3 very anxious and scared/hesitant to sit on EOB and stand.        Exercises      General Comments        Pertinent Vitals/Pain Pain Assessment Pain Assessment: Faces Faces Pain Scale: Hurts worst Pain Location: L proximal/middle/distal posterior thigh (hamstrings) Pre medicated 10 OXY 45 min prior Pain Descriptors / Indicators: Constant, Crying, Discomfort, Restless, Grimacing, Guarding Pain Intervention(s): Monitored during session, Repositioned, Premedicated before session,  Ice applied    Home Living                          Prior Function            PT Goals (current goals can now be found in the care plan section) Acute Rehab PT Goals Patient Stated Goal: I want this to be better to get back to my independence self PT Goal Formulation: With patient Time For Goal Achievement: 01/27/23 Progress towards PT goals: Progressing toward goals    Frequency    Min 1X/week      PT Plan      Co-evaluation              AM-PAC PT "6 Clicks" Mobility   Outcome Measure  Help needed turning from your back to your side while in a flat bed without using bedrails?: None Help needed moving from lying on your back to sitting on the side of a flat bed without using bedrails?: None Help needed moving to and from a bed to a chair (including a wheelchair)?: Total Help needed standing up from a chair using your arms (e.g., wheelchair or bedside chair)?: Total Help needed to walk in hospital room?: Total Help needed climbing 3-5 steps with a railing? : Total 6 Click Score: 12    End of Session Equipment Utilized During Treatment: Gait belt Activity Tolerance: Patient limited by pain Patient left: in bed;with call bell/phone within reach;with bed alarm set Nurse Communication: Mobility status PT Visit Diagnosis: Difficulty in walking, not elsewhere classified (R26.2);Pain Pain - Right/Left: Left Pain - part of body: Leg (thigh)     Time: 1478-2956 PT Time Calculation (min) (ACUTE ONLY): 26 min  Charges:    $Therapeutic Exercise: 23-37 mins PT General Charges $$ ACUTE PT VISIT: 1 Visit                     Lazaro Arms, SPTA 01/18/2023, 3:12 PM

## 2023-01-18 NOTE — Plan of Care (Signed)
Pt is A&O x 4. VSS, continues on room air. C/o 8/10 pain to left leg. Unable to give PRN IV pain meds due to no PIV access at the beginning of the shift. IV team consult placed. When IV team was at bedside, patient states that she would rather take PO meds and wanting her PO pain meds scheduled q3h instead of PRN. On-call Anthoney Harada, NP notified and updated. New order received for q3h pain assessments.  Also, Per NP, OK to hold off on IV if patient refusing, but if pt needs breakthrough IV pain med and pt needs PIV. Explained and educated patient about the PRN IV and PO pain medications frequency and pt verbalizes understanding.   IV team consult placed again and able to get new PIV to LFA. Pain managed with prn IV dilaudid, PRN po oxy and prn flexril and scheduled tylenol.   Pt c/o constipation and Anthoney Harada, NP notified and new order for Dulcolax suppository and suppository given as order with relief. Pt had 2 BM at this time.   Educated on falls and safety precautions, pt verbalizes understanding. Call bell in reach. Will continue to monitor.     Problem: Education: Goal: Knowledge of General Education information will improve Description: Including pain rating scale, medication(s)/side effects and non-pharmacologic comfort measures Outcome: Progressing   Problem: Health Behavior/Discharge Planning: Goal: Ability to manage health-related needs will improve Outcome: Progressing   Problem: Clinical Measurements: Goal: Ability to maintain clinical measurements within normal limits will improve Outcome: Progressing Goal: Will remain free from infection Outcome: Progressing Goal: Diagnostic test results will improve Outcome: Progressing Goal: Respiratory complications will improve Outcome: Progressing Goal: Cardiovascular complication will be avoided Outcome: Progressing   Problem: Activity: Goal: Risk for activity intolerance will decrease Outcome: Progressing   Problem:  Nutrition: Goal: Adequate nutrition will be maintained Outcome: Progressing   Problem: Coping: Goal: Level of anxiety will decrease Outcome: Progressing   Problem: Elimination: Goal: Will not experience complications related to bowel motility Outcome: Progressing Goal: Will not experience complications related to urinary retention Outcome: Progressing   Problem: Pain Management: Goal: General experience of comfort will improve Outcome: Progressing   Problem: Safety: Goal: Ability to remain free from injury will improve Outcome: Progressing   Problem: Skin Integrity: Goal: Risk for impaired skin integrity will decrease Outcome: Progressing

## 2023-01-18 NOTE — Progress Notes (Signed)
OT Cancellation Note  Patient Details Name: MAUDELLA LANGILL MRN: [id number] DOB: [birth year]   Cancelled Treatment:    Reason Eval/Treat Not Completed: Patient declined, no reason specified.  Patient with attempted mobility with PT a short time ago, pain remains a limiting factor.  OT to check back next date, advise to premedicate.    Aeron Donaghey D Lorrena Goranson 01/18/2023, 4:13 PM 01/18/2023  RP, OTR/L  Acute Rehabilitation Services  Office:  [phone]

## 2023-01-19 DIAGNOSIS — W010XXA Fall on same level from slipping, tripping and stumbling without subsequent striking against object, initial encounter: Secondary | ICD-10-CM | POA: Diagnosis not present

## 2023-01-19 DIAGNOSIS — S76392A Other specified injury of muscle, fascia and tendon of the posterior muscle group at thigh level, left thigh, initial encounter: Secondary | ICD-10-CM | POA: Diagnosis not present

## 2023-01-19 DIAGNOSIS — E039 Hypothyroidism, unspecified: Secondary | ICD-10-CM | POA: Diagnosis not present

## 2023-01-19 MED ORDER — ALPRAZOLAM 0.25 MG PO TABS
0.2500 mg | ORAL_TABLET | Freq: Two times a day (BID) | ORAL | Status: DC | PRN
  Administered 2023-01-19 – 2023-01-23 (×8): 0.25 mg via ORAL
  Filled 2023-01-19 (×8): qty 1

## 2023-01-19 MED ORDER — OXYCODONE HCL 5 MG PO TABS
5.0000 mg | ORAL_TABLET | ORAL | Status: DC | PRN
  Administered 2023-01-19 – 2023-01-21 (×8): 5 mg via ORAL
  Filled 2023-01-19 (×8): qty 1

## 2023-01-19 MED ORDER — HYDROMORPHONE HCL 1 MG/ML IJ SOLN
0.2500 mg | INTRAMUSCULAR | Status: DC | PRN
  Administered 2023-01-19 – 2023-01-21 (×7): 0.25 mg via INTRAVENOUS
  Filled 2023-01-19 (×7): qty 0.5

## 2023-01-19 MED ORDER — OXYCODONE HCL ER 10 MG PO T12A
10.0000 mg | EXTENDED_RELEASE_TABLET | Freq: Two times a day (BID) | ORAL | Status: DC
  Administered 2023-01-19 – 2023-01-23 (×9): 10 mg via ORAL
  Filled 2023-01-19 (×9): qty 1

## 2023-01-19 MED ORDER — HYDROMORPHONE HCL 1 MG/ML IJ SOLN
0.2500 mg | Freq: Once | INTRAMUSCULAR | Status: AC
  Administered 2023-01-19: 0.25 mg via INTRAVENOUS
  Filled 2023-01-19: qty 0.5

## 2023-01-19 NOTE — Progress Notes (Signed)
0630: pt's husband called and would like to talk to the doctor regarding patient pain management.  Dr. Hughie Closs notified via secure chat.

## 2023-01-19 NOTE — TOC Progression Note (Signed)
Transition of Care [facility]) - Progression Note    Patient Details  Name: Christina Howell MRN: [id number] Date of Birth: Jul 18, 1955  [facility] CM/SW Contact  Adrian Prows, RN Phone Number: 01/19/2023, 12:05 PM  Clinical Narrative:    Pt given list of bed offers: [facility], [facility], [facility], [place], [place], [place], [place] [facility], [place], [place], [place], [facility], [facility], and [facility]; awaiting choice; ins auth.    Expected Discharge Plan: Home/Self Care Barriers to Discharge: Continued Medical Work up  Expected Discharge Plan and Services In-house Referral: Clinical Social Work   Post Acute Care Choice: Durable Medical Equipment Living arrangements for the past 2 months: Single Family Home                 DME Arranged: Walker rolling DME Agency: [employer] Date DME Agency Contacted: 01/14/23 Time DME Agency Contacted: 1431 Representative spoke with at DME Agency: Selena Batten             Social Determinants of Health (SDOH) Interventions SDOH Screenings   Food Insecurity: No Food Insecurity (01/13/2023)  Housing: Low Risk  (01/13/2023)  Transportation Needs: No Transportation Needs (01/13/2023)  Utilities: Not At Risk (01/13/2023)  Social Connections: Unknown (07/12/2021)   Received from [facility], [facility]  Tobacco Use: Low Risk  (01/13/2023)    Readmission Risk Interventions     No data to display

## 2023-01-19 NOTE — Progress Notes (Signed)
Noted pt crying in pain and c/o severe pain to left leg. PrN IV dilaudid 0.5 mg given as ordered. Will continue to monitor.

## 2023-01-19 NOTE — Progress Notes (Signed)
Physical Therapy Treatment Patient Details Name: Christina Howell MRN: [id number] DOB: [birth year] Today's Date: 01/19/2023   History of Present Illness Patient is a 67 year old female slipped and fell while bowling with family. LLE went into extreme hip flexion with knee extension and landed on Left hip. x rays are negative however pt cannot straighten L LE withotu sever pain in posterior L upper thigh ( hamstring) area. MRI (+) complete avulsion L hamstring tendon-orthopedics consulted-recommend non-op mgmt, f/u as OP, WBAT.    PT Comments  Pt was seen in bed with partner in room. She was premedicated with pain meds and xanax prior to session. Pt performed supine to sit/sidelying to sit with min assist to bring trunk to upright position. Pt was hesitant to relax L LE and would get to the EOB and then cry and jump back into bed. After two attempts, pt got to EOB and performed sit to stand with RW and mod assist. She stood for <30 seconds, then sat back down due to pain. Pt performed sit to stand again with max encouragement, and ambulated 3 ft to window seat requiring min assistance. After resting, she performed sit to stand from window seat and ambulated 3 ft back to bed, also requiring min assistance and max encouragement. Pt stated she is still deciding which SNF she wants to go to. LPT recommends SNF.   If plan is discharge home, recommend the following: A lot of help with walking and/or transfers;A lot of help with bathing/dressing/bathroom;Assistance with cooking/housework;Help with stairs or ramp for entrance   Can travel by private vehicle     No  Equipment Recommendations  Rolling walker (2 wheels)    Recommendations for Other Services       Precautions / Restrictions Precautions Precautions: Fall Precaution Comments: RICE along with trans/gait with AD to tolerance until f/u Ortho MD per LPT Restrictions Weight Bearing Restrictions: Yes LLE Weight Bearing: Weight bearing as tolerated      Mobility  Bed Mobility Overal bed mobility: Needs Assistance Bed Mobility: Sit to Supine, Rolling, Supine to Sit Rolling: Supervision   Supine to sit: Contact guard, Min assist, HOB elevated Sit to supine: HOB elevated, Supervision   General bed mobility comments: Pt performed supine to sit with min hand held assist. Pt could independently perform sit to supine, very hesitant to sit on EOB.    Transfers Overall transfer level: Needs assistance Equipment used: Rolling walker (2 wheels) Transfers: Sit to/from Stand Sit to Stand: Mod assist           General transfer comment: Pt was cued to quickly stand up since her being hesitant and slow seems to increase pain. Pt stood for 8 seconds before crying out and jumping back into bed.    Ambulation/Gait Ambulation/Gait assistance: Min assist, Contact guard assist Gait Distance (Feet): 6 Feet Assistive device: Rolling walker (2 wheels) Gait Pattern/deviations: Step-to pattern, Decreased stance time - left, Antalgic Gait velocity: decreased     General Gait Details: 3 ft x 2, pt ambulated with a hop with R LE.   Stairs             Wheelchair Mobility     Tilt Bed    Modified Rankin (Stroke Patients Only)       Balance  Cognition Arousal: Alert Behavior During Therapy: WFL for tasks assessed/performed Overall Cognitive Status: Within Functional Limits for tasks assessed                                 General Comments: AxO x 3 very anxious and hesitant during session, required encouragement        Exercises      General Comments        Pertinent Vitals/Pain Pain Assessment Pain Score: 10-Worst pain ever Faces Pain Scale: Hurts worst Pain Location: L LE, anterior and posterior thigh Pain Descriptors / Indicators: Constant, Crying, Discomfort, Restless, Grimacing, Guarding Pain Intervention(s): Monitored during  session, Repositioned, Ice applied    Home Living                          Prior Function            PT Goals (current goals can now be found in the care plan section) Acute Rehab PT Goals Patient Stated Goal: I want this to be better to get back to my independence self PT Goal Formulation: With patient Time For Goal Achievement: 01/27/23 Progress towards PT goals: Progressing toward goals    Frequency    Min 1X/week      PT Plan      Co-evaluation              AM-PAC PT "6 Clicks" Mobility   Outcome Measure  Help needed turning from your back to your side while in a flat bed without using bedrails?: A Little Help needed moving from lying on your back to sitting on the side of a flat bed without using bedrails?: A Little Help needed moving to and from a bed to a chair (including a wheelchair)?: Total Help needed standing up from a chair using your arms (e.g., wheelchair or bedside chair)?: Total Help needed to walk in hospital room?: Total Help needed climbing 3-5 steps with a railing? : Total 6 Click Score: 10    End of Session Equipment Utilized During Treatment: Gait belt Activity Tolerance: Patient limited by pain Patient left: in bed;with call bell/phone within reach;with bed alarm set;with family/visitor present Nurse Communication: Mobility status PT Visit Diagnosis: Difficulty in walking, not elsewhere classified (R26.2);Pain Pain - Right/Left: Left Pain - part of body: Leg (thigh)     Time: 9604-5409 PT Time Calculation (min) (ACUTE ONLY): 24 min  Charges:    $Gait Training: 8-22 mins $Therapeutic Activity: 8-22 mins PT General Charges $$ ACUTE PT VISIT: 1 Visit                    Lazaro Arms, SPTA 01/19/2023, 3:56 PM

## 2023-01-19 NOTE — Progress Notes (Signed)
PROGRESS NOTE    Christina Howell  JYN:[id number] DOB: 12-08-1955 DOA: 01/12/2023 PCP: [name], MD   Brief Narrative:  This 67 y.o. female with PMH significant of acquired hypothyroidism, GERD, who presented in the ED with acute left hip pain after ground-level mechanical , Patient has been complaining of acute left hip pain after falling while bowling. Initial x-ray workup was negative in the emergency department but due to intractable pain and difficulty with ambulation , Patient was admitted for further evaluation. After thorough evaluation MRI imaging reveals complete avulsion of the left hamstring tendon with 3 to 4 cm of tendon retraction.  Case was discussed with Dr. [name] with orthopedic surgery who recommended crutches, weightbearing as tolerated and outpatient evaluation by orthopedic surgery in 1 week.   Opiate-based analgesics have been used due to patient substantial associated pain.    Assessment & Plan:   Active Problems:   Hypokalemia   Acquired hypothyroidism   Urinary incontinence   GERD (gastroesophageal reflux disease)   Avulsion of hamstring muscle, left, initial encounter   Fall from slipping, initial encounter  Fall from slipping leading to avulsion of hamstring muscle, left, initial encounter: Dr.Daldorf with orthopedic surgery had evaluated the patient on 01/14/2023. Plan is for titration of opiate based analgesics and slow gradual attempts to get the patient out of bed using a rolling walker. Patient is then to follow-up with Dr. [name] in orthopedic clinic in 10 days It is likely that conservative management will be pursued. She has been on Flexeril 3 times daily and oxycodone 10 mg every 3 hours as needed and she has been receiving that every 3 hours literally and yet patient still complains about severe pain, she has not get out of bed yet.  Does not feel ready to go home.  Has been getting evaluation by PT OT on daily basis.  This morning, I received a  page from the nurses that patient's husband is upset about poor pain control.  I saw patient at around 10:30 AM.  Long discussion for about 20 minutes inpatient room.  Husband demanded that instead of keeping as needed every 3 hours, I make her pain medications scheduled every 3 hours because " patient does not get medications at 3 AM, because she is sleeping and cannot ask for pain medication".  I explained to him that opioid medications are typically ordered as needed because that gives patient the control to ask for medications, as much or as less as patient wants.  And the fact that she is sleeping at 3 AM means she is pain-free and does not require pain medications.  It would not make sense to wake up sleeping patient and give her pain medication as that may cause delirium.  Despite of explaining the rationale, husband remained very rigid and demanding.  I then gave up and told him that okay I would do what ever you want me to do but then I will not be held responsible for the delirium.  At that moment, he changed his gears.  I answered several questions.  He was also questioning why this patient is not on NSAIDs.  I have answered that NSAIDs are not going to work if opioids are not working for her.  Making Long story short, I suggested we start her on oxycodone long-acting 10 mg twice daily and keep her on as needed oxycodone short acting as well as IV Dilaudid.  He agreed to that but earlier he was asking me to control  her pain medications and increase the pain medications but then at the end he asked me to reduce the Dilaudid from 0.5 mg every 3 hours to 0.25 mg every 3 hours as needed because " she has had trouble tolerating opioids in the past".  This definitely was very conflicting and confusing.  Regardless, he wanted me to go ahead and do that.  We did that as well as reduce her as needed oxycodone to 5 mg as well now that she is on long-acting.  He also demanded that orthopedics come and see her again.   I sent a secure chat message and then paged and was able to speak to Dr. [name] and explained everything to him and he is going to see this patient again today.  He also claimed that PT OT is not communicating with the nurses to give her pain medications before they start working with her.  I did verify this with nurse as well as PT OT and per them, they have been communicating and patient has been premedicated before working with the PT.   Hypokalemia: Resolved.   Acquired hypothyroidism: Continuing levothyroxine.   Urinary incontinence: Continue Myrbetriq.   GERD (gastroesophageal reflux disease): Continue Protonix.  DVT prophylaxis: SCDs Start: 01/12/23 2209   Code Status: Full Code  Family Communication:  None present at bedside.  Plan of care discussed with patient in length and he/she verbalized understanding and agreed with it.  Status is: Inpatient Remains inpatient appropriate because: Still with significant pain to the left lower extremity, PT to reassess, Ortho to come by as well.   Estimated body mass index is 28.49 kg/m as calculated from the following:   Height as of this encounter: 5\' 8"  (1.727 m).   Weight as of this encounter: 85 kg.    Nutritional Assessment: Body mass index is 28.49 kg/m.Marland Kitchen Seen by dietician.  I agree with the assessment and plan as outlined below: Nutrition Status:        . Skin Assessment: I have examined the patient's skin and I agree with the wound assessment as performed by the wound care RN as outlined below:    Consultants:  Orthopedics  Procedures:  None  Antimicrobials:  Anti-infectives (From admission, onward)    None         Subjective: Seen and examined.  Still with left lower extremity pain.  Details above.  Patient in tears.  Objective: Vitals:   01/18/23 1928 01/19/23 0424 01/19/23 0704 01/19/23 0706  BP: (!) 132/54 124/83  126/79  Pulse: 96 87 80 81  Resp: 17 (!) 24  20  Temp: 98 F (36.7 C) (!)  97.5 F (36.4 C)    TempSrc: Oral Oral    SpO2: 97% 100% 100% 94%  Weight:  85 kg    Height:        Intake/Output Summary (Last 24 hours) at 01/19/2023 1320 Last data filed at 01/19/2023 0930 Gross per 24 hour  Intake 120 ml  Output --  Net 120 ml   Filed Weights   01/15/23 0521 01/18/23 0453 01/19/23 0424  Weight: 81.6 kg 83 kg 85 kg    Examination:  General exam: Appears calm and comfortable  Respiratory system: Clear to auscultation. Respiratory effort normal. Cardiovascular system: S1 & S2 heard, RRR. No JVD, murmurs, rubs, gallops or clicks. No pedal edema. Gastrointestinal system: Abdomen is nondistended, soft and nontender. No organomegaly or masses felt. Normal bowel sounds heard. Central nervous system: Alert and oriented. No focal  neurological deficits. Extremities: Limited range of motion of the left lower extremity. Skin: No rashes, lesions or ulcers.  Psychiatry: Judgement and insight appear normal. Mood & affect appropriate.   Data Reviewed: I have personally reviewed following labs and imaging studies  CBC: Recent Labs  Lab 01/12/23 2110 01/13/23 0509 01/14/23 0728 01/15/23 0858  WBC 7.0 6.3 4.7 4.9  NEUTROABS 4.5 3.9  --   --   HGB 11.6* 12.7 13.4 13.2  HCT 34.4* 38.0 38.5 37.7  MCV 92.5 91.3 91.4 91.1  PLT 163 181 154 163   Basic Metabolic Panel: Recent Labs  Lab 01/12/23 2110 01/13/23 0509 01/14/23 0728 01/18/23 0950  NA 139 133* 137 131*  K 3.2* 3.6 4.1 4.0  CL 109 102 105 99  CO2 23 24 25 24   GLUCOSE 103* 101* 97 123*  BUN 14 13 17 15   CREATININE 0.53 0.61 0.58 0.65  CALCIUM 8.0* 8.5* 8.6* 8.3*  MG 2.1 2.1  --   --    GFR: Estimated Creatinine Clearance: 79 mL/min (by C-G formula based on SCr of 0.65 mg/dL). Liver Function Tests: Recent Labs  Lab 01/13/23 0509  AST 20  ALT 20  ALKPHOS 44  BILITOT 1.1  PROT 5.9*  ALBUMIN 3.8   No results for input(s): "LIPASE", "AMYLASE" in the last 168 hours. No results for input(s):  "AMMONIA" in the last 168 hours. Coagulation Profile: No results for input(s): "INR", "PROTIME" in the last 168 hours. Cardiac Enzymes: No results for input(s): "CKTOTAL", "CKMB", "CKMBINDEX", "TROPONINI" in the last 168 hours. BNP (last 3 results) No results for input(s): "PROBNP" in the last 8760 hours. HbA1C: No results for input(s): "HGBA1C" in the last 72 hours. CBG: No results for input(s): "GLUCAP" in the last 168 hours. Lipid Profile: No results for input(s): "CHOL", "HDL", "LDLCALC", "TRIG", "CHOLHDL", "LDLDIRECT" in the last 72 hours. Thyroid Function Tests: No results for input(s): "TSH", "T4TOTAL", "FREET4", "T3FREE", "THYROIDAB" in the last 72 hours. Anemia Panel: No results for input(s): "VITAMINB12", "FOLATE", "FERRITIN", "TIBC", "IRON", "RETICCTPCT" in the last 72 hours. Sepsis Labs: No results for input(s): "PROCALCITON", "LATICACIDVEN" in the last 168 hours.  No results found for this or any previous visit (from the past 240 hour(s)).   Radiology Studies: No results found.  Scheduled Meds:  acetaminophen  650 mg Oral BID   levothyroxine  50 mcg Oral Q0600   lidocaine  1 patch Transdermal Q24H   mirabegron ER  50 mg Oral QHS   oxyCODONE  10 mg Oral Q12H   pantoprazole  20 mg Oral Daily   senna  1 tablet Oral QHS   Continuous Infusions:   LOS: 5 days   Hughie Closs, MD [facility]  01/19/2023, 1:20 PM   *Please note that this is a verbal dictation therefore any spelling or grammatical errors are due to the "Dragon [employer] One" system interpretation.  Please page via Amion and do not message via secure chat for urgent patient care matters. Secure chat can be used for non urgent patient care matters.  How to contact the [facility] Attending or Consulting provider 7A - 7P or covering provider during after hours 7P -7A, for this patient?  Check the care team in [facility] and look for a) attending/consulting TRH provider listed and b) the [facility] team listed. Page or  secure chat 7A-7P. Log into [email or web address] and use [facility]'s universal password to access. If you do not have the password, please contact the hospital operator. Locate the [facility] provider you  are looking for under [facility] and page to a number that you can be directly reached. If you still have difficulty reaching the provider, please page the [facility] (Director on Call) for the Hospitalists listed on amion for assistance.

## 2023-01-19 NOTE — Plan of Care (Signed)

## 2023-01-19 NOTE — Progress Notes (Signed)
Met again with Christina Howell and her partner.  Not much progress and continued pain.  I can move her leg with less pain today.  Now has long acting and short acting pain meds.  Hopeful she will tun corner this weekend.  Will check again on Monday.  May need to go to rehab. Still trying to avoid OR. This is something we rarely operate on as most do very well once pain subsides.

## 2023-01-19 NOTE — Progress Notes (Signed)
Per nurses, patient's husband reportedly has been very rude and demanding and impatient with the nursing staff.  He was extremely rude to me during this morning as well.  I saw patient at 10:30 AM, he said why I was late since the message was sent to me around 8 AM.  I had to explain him that I have several other patients on my list that I have to see and I need to prioritize sicker patients first.  Patient was extremely loud during the whole session and kept getting louder and at that point in time, I had to remind him to be respectful as loud and disrespectful behavior is not going to achieve anything.  I reassured him that I am here to help the patient and I have the best intentions in patients best interest and I request his trust and understanding as we have to be very careful to avoid consequences of heavy doses of opioids.

## 2023-01-19 NOTE — Progress Notes (Signed)
Patient c/o pain 9/10 on pain reassessment. RN contacted Hospitalist MD for pain management. MD quickly responded, he will come to bedside, Patient and patient husband informed.  Patient requesting to see Ortho MD, which was contacted, and came to bedside.  Patient was repositioned, Ice applied to left lateral leg, PRN muscle relaxer given, and later PRN OXY.  Patient husband was very agitated and verbally agressive, pacing the floor and interrupting RN asking for pain medicine after the pain management modalities were explained, that it was not time for the next dose.  Patient husband was asked to use the call bell for assistance, instead he comes to find the RN/NT at the nursing station.  See new orders.

## 2023-01-19 NOTE — Plan of Care (Addendum)
Pt is A&O x 4. VSS, continues on room air. Pt c/o severe left leg pain and PRN IV dilaudid, po oxy, po flexeril, po tylenol given as ordered and pt still with c/o severe pain.   0400: noted pt crying aloud in severe pain to her left leg and pt received prn oxy at 0236 and not due for pain medication. Anthoney Harada, NP notified and one time dose IV dilaudid 0.25mg  given as ordered along with prn flexeril and prn tylenol given. Upon reassessment - noted pt resting comfortably.  Call bell in reach. Will continue to monitor.    Problem: Education: Goal: Knowledge of General Education information will improve Description: Including pain rating scale, medication(s)/side effects and non-pharmacologic comfort measures Outcome: Progressing   Problem: Health Behavior/Discharge Planning: Goal: Ability to manage health-related needs will improve Outcome: Progressing   Problem: Clinical Measurements: Goal: Ability to maintain clinical measurements within normal limits will improve Outcome: Progressing Goal: Will remain free from infection Outcome: Progressing Goal: Diagnostic test results will improve Outcome: Progressing Goal: Respiratory complications will improve Outcome: Progressing Goal: Cardiovascular complication will be avoided Outcome: Progressing   Problem: Activity: Goal: Risk for activity intolerance will decrease Outcome: Progressing   Problem: Nutrition: Goal: Adequate nutrition will be maintained Outcome: Progressing   Problem: Coping: Goal: Level of anxiety will decrease Outcome: Progressing   Problem: Elimination: Goal: Will not experience complications related to bowel motility Outcome: Progressing Goal: Will not experience complications related to urinary retention Outcome: Progressing   Problem: Pain Management: Goal: General experience of comfort will improve Outcome: Progressing   Problem: Safety: Goal: Ability to remain free from injury will  improve Outcome: Progressing   Problem: Skin Integrity: Goal: Risk for impaired skin integrity will decrease Outcome: Progressing

## 2023-01-20 DIAGNOSIS — E039 Hypothyroidism, unspecified: Secondary | ICD-10-CM | POA: Diagnosis not present

## 2023-01-20 MED ORDER — LIDOCAINE 5 % EX PTCH
1.0000 | MEDICATED_PATCH | CUTANEOUS | Status: DC
  Administered 2023-01-21 – 2023-01-22 (×2): 1 via TRANSDERMAL
  Filled 2023-01-20 (×2): qty 1

## 2023-01-20 MED ORDER — ENOXAPARIN SODIUM 40 MG/0.4ML IJ SOSY
40.0000 mg | PREFILLED_SYRINGE | Freq: Every day | INTRAMUSCULAR | Status: DC
  Administered 2023-01-20 – 2023-01-23 (×4): 40 mg via SUBCUTANEOUS
  Filled 2023-01-20 (×4): qty 0.4

## 2023-01-20 NOTE — Progress Notes (Signed)
PHARMACY - ANTICOAGULATION CONSULT NOTE  Pharmacy Consult for Lovenox Indication: VTE prophylaxis  Allergies  Allergen Reactions   Diphenhydramine Hcl Other (See Comments)    hyper    Patient Measurements: Height: 5\' 8"  (172.7 cm) Weight: 85.2 kg (187 lb 13.3 oz) IBW/kg (Calculated) : 63.9  Vital Signs: Temp: 97.5 F (36.4 C) (11/23 0443) BP: 112/67 (11/23 0443) Pulse Rate: 87 (11/23 0443)  Labs: Recent Labs    01/18/23 0950  CREATININE 0.65    Estimated Creatinine Clearance: 79.1 mL/min (by C-G formula based on SCr of 0.65 mg/dL).   Medical History: Past Medical History:  Diagnosis Date   Acquired hypothyroidism    Eczema    dermatologist--- Dr. Lonni Fix    GERD (gastroesophageal reflux disease)    Heel spur 2009   by x-ray   Herniated disc    L4-L5 and T11-T12----has seen neurosurgeon Dr. Venetia Maxon   Plantar fasciitis    Urinary incontinence    Varicose vein     Medications:  No prior to admission nor inpatient anticoagulation medications listed  Assessment: Pharmacy consulted to dose enoxaparin for VTE prophylaxis for this 67 yo female  weight is greater than 45 kg and  BMI is below 30   CBC WNL   Goal of Therapy:  VTE prophylaxis Monitor platelets by anticoagulation protocol: Yes   Plan:  Lovenox 40 mg subcutaneous daily With no dose adjustment anticipated, Pharmacy will sign off consult and continue to monitor CBC as ordered by provider and signs/symptoms of bleeding.    Thank you for allowing pharmacy to be a part of this patient's care.  Selinda Eon, PharmD, BCPS Clinical Pharmacist [facility] 01/20/2023 11:42 AM

## 2023-01-20 NOTE — Plan of Care (Signed)
  Problem: Education: Goal: Knowledge of General Education information will improve Description: Including pain rating scale, medication(s)/side effects and non-pharmacologic comfort measures 01/20/2023 0252 by Jearl Klinefelter, RN Outcome: Progressing 01/20/2023 0252 by Jearl Klinefelter, RN Outcome: Progressing 01/19/2023 2347 by Jearl Klinefelter, RN Outcome: Progressing   Problem: Health Behavior/Discharge Planning: Goal: Ability to manage health-related needs will improve 01/20/2023 0252 by Jearl Klinefelter, RN Outcome: Progressing 01/20/2023 0252 by Jearl Klinefelter, RN Outcome: Progressing 01/19/2023 2347 by Jearl Klinefelter, RN Outcome: Progressing   Problem: Clinical Measurements: Goal: Ability to maintain clinical measurements within normal limits will improve 01/20/2023 0252 by Jearl Klinefelter, RN Outcome: Progressing 01/20/2023 0252 by Jearl Klinefelter, RN Outcome: Progressing 01/19/2023 2347 by Jearl Klinefelter, RN Outcome: Progressing Goal: Will remain free from infection 01/20/2023 0252 by Jearl Klinefelter, RN Outcome: Progressing 01/20/2023 0252 by Jearl Klinefelter, RN Outcome: Progressing 01/19/2023 2347 by Jearl Klinefelter, RN Outcome: Progressing Goal: Diagnostic test results will improve 01/20/2023 0252 by Jearl Klinefelter, RN Outcome: Progressing 01/20/2023 0252 by Jearl Klinefelter, RN Outcome: Progressing 01/19/2023 2347 by Jearl Klinefelter, RN Outcome: Progressing Goal: Respiratory complications will improve 01/20/2023 0252 by Jearl Klinefelter, RN Outcome: Progressing 01/20/2023 0252 by Jearl Klinefelter, RN Outcome: Progressing 01/19/2023 2347 by Jearl Klinefelter, RN Outcome: Progressing Goal: Cardiovascular complication will be avoided 01/20/2023 0252 by Jearl Klinefelter, RN Outcome: Progressing 01/20/2023 0252 by Jearl Klinefelter, RN Outcome: Progressing 01/19/2023 2347 by  Jearl Klinefelter, RN Outcome: Progressing

## 2023-01-20 NOTE — Plan of Care (Signed)
  Problem: Education: Goal: Knowledge of General Education information will improve Description: Including pain rating scale, medication(s)/side effects and non-pharmacologic comfort measures 01/20/2023 0252 by Jearl Klinefelter, RN Outcome: Progressing 01/19/2023 2347 by Jearl Klinefelter, RN Outcome: Progressing   Problem: Health Behavior/Discharge Planning: Goal: Ability to manage health-related needs will improve 01/20/2023 0252 by Jearl Klinefelter, RN Outcome: Progressing 01/19/2023 2347 by Jearl Klinefelter, RN Outcome: Progressing   Problem: Clinical Measurements: Goal: Ability to maintain clinical measurements within normal limits will improve 01/20/2023 0252 by Jearl Klinefelter, RN Outcome: Progressing 01/19/2023 2347 by Jearl Klinefelter, RN Outcome: Progressing Goal: Will remain free from infection 01/20/2023 0252 by Jearl Klinefelter, RN Outcome: Progressing 01/19/2023 2347 by Jearl Klinefelter, RN Outcome: Progressing Goal: Diagnostic test results will improve 01/20/2023 0252 by Jearl Klinefelter, RN Outcome: Progressing 01/19/2023 2347 by Jearl Klinefelter, RN Outcome: Progressing Goal: Respiratory complications will improve 01/20/2023 0252 by Jearl Klinefelter, RN Outcome: Progressing 01/19/2023 2347 by Jearl Klinefelter, RN Outcome: Progressing Goal: Cardiovascular complication will be avoided 01/20/2023 0252 by Jearl Klinefelter, RN Outcome: Progressing 01/19/2023 2347 by Jearl Klinefelter, RN Outcome: Progressing

## 2023-01-20 NOTE — TOC Progression Note (Addendum)
Transition of Care [facility]) - Progression Note    Patient Details  Name: Christina Howell MRN: [id number] Date of Birth: 12-02-1955  [facility] CM/SW Contact  Darleene Cleaver, [place] Phone Number: 01/20/2023, 3:15 PM  Clinical Narrative:     CSW spoke to patient, she is still trying to decide on SNF.  CSW informed her that a decision needs to made soon so insurance authorization can be started.  4:20pm CSW spoke to patient and her significant other who was at bedside.  Per patient and significant other, they would like to go to [facility] for rehab.  CSW explained to them that typically they do not accept patients who do not live at [facility], and they usually don't take insurance for SNF placement.  Per patient and her significant other, they volunteer at [facility] and know the admissions worker Jackelyn Poling and they feel they can get her in there.  CSW advised for them to try to call and see what they say.  CSW also called and left a message on Katie's voice mail.  Per patient's significant other he will talk to [facility] on Monday and see what he can do.  CSW suggested they look at the bed offers that were provided as a back up plan in case they can not get into [facility].  CSW informed them that patient will have to have insurance authorization prior to discharge which may take a day or two, and it is important to make a decision so insurance auth can be started.  CSW also explained to them that if patient does like any of the SNFs that are available and can not get into [facility], the only other option would be home with home health.  Per patient and sig other, home health is not possible because there is no bedroom on the first floor.  CSW expressed understanding, and reiterated to look at the other options as well for SNF placement.    CSW explained that if patient is medically ready for discharge, they will have to make a decision.  CSW also reminded  them that if physician decides that she is ready for discharge, and they don't agree, they can appeal the decision.  Per patient and significant other, they are aware and saw the steps that need to be done on the important message.   Patient and significant other asked about [place] and [place], CSW informed them that [facility] does not have any beds available and have already declined her.  CSW also informed them that [facility] does not typically take patient's unless you already live there.  CSW will send patient's information to [facility] and [facility] for them to review.    Expected Discharge Plan: Home/Self Care Barriers to Discharge: Continued Medical Work up  Expected Discharge Plan and Services In-house Referral: Clinical Social Work   Post Acute Care Choice: Durable Medical Equipment Living arrangements for the past 2 months: Single Family Home                 DME Arranged: Walker rolling DME Agency: [employer] Date DME Agency Contacted: 01/14/23 Time DME Agency Contacted: 1431 Representative spoke with at DME Agency: Selena Batten             Social Determinants of Health (SDOH) Interventions SDOH Screenings   Food Insecurity: No Food Insecurity (01/13/2023)  Housing: Low Risk  (01/13/2023)  Transportation Needs: No Transportation Needs (01/13/2023)  Utilities:  Not At Risk (01/13/2023)  Social Connections: Unknown (07/12/2021)   [facility] from [facility], [facility]  Tobacco Use: Low Risk  (01/13/2023)    Readmission Risk Interventions     No data to display

## 2023-01-20 NOTE — Progress Notes (Addendum)
Physical Therapy Treatment Patient Details Name: Christina Howell MRN: [id number] DOB: Jun 25, 1955 Today's Date: 01/20/2023   History of Present Illness Patient is a 67 year old female slipped and fell while bowling with family. LLE went into extreme hip flexion with knee extension and landed on Left hip. x rays are negative however pt cannot straighten L LE withotu sever pain in posterior L upper thigh ( hamstring) area. MRI (+) complete avulsion L hamstring tendon-orthopedics consulted-recommend non-op mgmt, f/u as OP, WBAT.    PT Comments  Pt remains limited by pain with  activity. Lengthy discussion (with pt and spouse) regarding pt's injury, recovery process and expectations. Reviewed positions that incr stress on L hamstring and proper positioning to decr stress, aide healing and decr pain. Pt and spouse verbalize understanding. Attempted to ACE wrap L thigh to improve pain with eventual mobility, pt unable to tolerate more than distal thigh wrapping this session. Reviewed plan to continue wrapping to proximal thigh with anchor at waist to provide additional support LLE. Pt is in agreement however she needed IV meds after rolling in bed and distal thigh wrapping so decision was made to attempt  again next day/as schedule allows.   If plan is discharge home, recommend the following: A lot of help with walking and/or transfers;A lot of help with bathing/dressing/bathroom;Assistance with cooking/housework;Help with stairs or ramp for entrance   Can travel by private vehicle     No  Equipment Recommendations  Rolling walker (2 wheels)    Recommendations for Other Services       Precautions / Restrictions Precautions Precautions: Fall Restrictions Weight Bearing Restrictions: No Other Position/Activity Restrictions: avoid full knee extension and end range hip flexion     Mobility  Bed Mobility Overal bed mobility: Needs Assistance Bed Mobility: Rolling Rolling: Supervision          General bed mobility comments: reviewed pain with sitting and reasoning for this with pt and spouse. reviewed potential options for transfers. pt in 10/10 pain and unable to sit EOB this session    Transfers                   General transfer comment: unable d/t pain    Ambulation/Gait                   Stairs             Wheelchair Mobility     Tilt Bed    Modified Rankin (Stroke Patients Only)       Balance                                            Cognition Arousal: Alert Behavior During Therapy: WFL for tasks assessed/performed Overall Cognitive Status: Within Functional Limits for tasks assessed                                 General Comments: pt is anxious regarding d/c plan, pain and progress with mobility within limits of pain        Exercises General Exercises - Lower Extremity Ankle Circles/Pumps: AROM, Both, 5 reps Quad Sets: AROM, Right, 5 reps Straight Leg Raises: AROM, Right, 5 reps    General Comments        Pertinent Vitals/Pain Pain Assessment Pain Assessment: Faces Faces  Pain Scale: Hurts worst Pain Location: L posterior thigh Pain Descriptors / Indicators: Jabbing, Guarding, Crying Pain Intervention(s): Limited activity within patient's tolerance, Monitored during session, Premedicated before session, Patient requesting pain meds-RN notified, RN gave pain meds during session, Ice applied (RN to ice)    Home Living                          Prior Function            PT Goals (current goals can now be found in the care plan section) Acute Rehab PT Goals Patient Stated Goal: I want this to be better to get back to my independent self PT Goal Formulation: With patient Time For Goal Achievement: 01/27/23 Potential to Achieve Goals: Good Progress towards PT goals: Not progressing toward goals - comment (pain limiting activity)    Frequency    Min 1X/week       PT Plan      Co-evaluation              AM-PAC PT "6 Clicks" Mobility   Outcome Measure  Help needed turning from your back to your side while in a flat bed without using bedrails?: A Little Help needed moving from lying on your back to sitting on the side of a flat bed without using bedrails?: A Little Help needed moving to and from a bed to a chair (including a wheelchair)?: Total Help needed standing up from a chair using your arms (e.g., wheelchair or bedside chair)?: Total Help needed to walk in hospital room?: Total Help needed climbing 3-5 steps with a railing? : Total 6 Click Score: 10    End of Session   Activity Tolerance: Patient limited by pain Patient left: in bed;with call bell/phone within reach;with bed alarm set;with family/visitor present Nurse Communication: Mobility status PT Visit Diagnosis: Difficulty in walking, not elsewhere classified (R26.2);Pain Pain - Right/Left: Left Pain - part of body: Leg     Time: 6213-0865 PT Time Calculation (min) (ACUTE ONLY): 51 min  Charges:    $Therapeutic Activity: 38-52 mins PT General Charges $$ ACUTE PT VISIT: 1 Visit                     Marquita Lias, PT  Acute Rehab Dept [facility]) [phone]  01/20/2023    [facility] 01/20/2023, 5:19 PM

## 2023-01-20 NOTE — Progress Notes (Signed)
PROGRESS NOTE    Christina Howell  BMW:[id number] DOB: Jul 20, 1955 DOA: 01/12/2023 PCP: [name], MD   Brief Narrative:  This 67 y.o. female with PMH significant of acquired hypothyroidism, GERD, who presented in the ED with acute left hip pain after ground-level mechanical , Patient has been complaining of acute left hip pain after falling while bowling. Initial x-ray workup was negative in the emergency department but due to intractable pain and difficulty with ambulation , Patient was admitted for further evaluation. After thorough evaluation MRI imaging revealed complete avulsion of the left hamstring tendon with 3 to 4 cm of tendon retraction.  Case was discussed with Dr. [name] with orthopedic surgery who recommended crutches, weightbearing as tolerated and outpatient evaluation by orthopedic surgery in 1 week.     Assessment & Plan:   Active Problems:   Hypokalemia   Acquired hypothyroidism   Urinary incontinence   GERD (gastroesophageal reflux disease)   Avulsion of hamstring muscle, left, initial encounter   Fall from slipping, initial encounter  Fall from slipping leading to avulsion of hamstring muscle, left, initial encounter: Dr.Daldorf with orthopedic surgery had evaluated the patient on 01/14/2023. Plan is for titration of opiate based analgesics and slow gradual attempts to get the patient out of bed using a rolling walker. Patient is then to follow-up with Dr. [name] in orthopedic clinic in 10 days It is likely that conservative management will be pursued.  Due to continued pain despite of the extreme pain regime, we have changed her to long-acting oxycodone 10 mg twice daily along with short acting oxycodone every 3 hours with IV Dilaudid.  Per her request, she was seen by Dr. Priscille Loveless again on 01/19/2023, he yet again recommended no surgical intervention.  Patient's pain appears to be improving today.  Being seen by PT OT.  Recommendation for SNF which TOC is working  on.   Hypokalemia: Resolved.   Acquired hypothyroidism: Continuing levothyroxine.   Urinary incontinence: Continue Myrbetriq.   GERD (gastroesophageal reflux disease): Continue Protonix.  Anxiety: I was reported yesterday by PT that patient's anxiety is also restricting her from fully participating with PT and she often yells and becomes panicky.  Xanax was ordered as needed and that has worked well for her.  DVT prophylaxis: enoxaparin (LOVENOX) injection 40 mg Start: 01/20/23 1230   Code Status: Full Code  Family Communication:  None present at bedside.  Plan of care discussed with patient in length and he/she verbalized understanding and agreed with it.  Status is: Inpatient Remains inpatient appropriate because: Medically stable, pending placement.   Estimated body mass index is 28.56 kg/m as calculated from the following:   Height as of this encounter: 5\' 8"  (1.727 m).   Weight as of this encounter: 85.2 kg.    Nutritional Assessment: Body mass index is 28.56 kg/m.Marland Kitchen Seen by dietician.  I agree with the assessment and plan as outlined below: Nutrition Status:        . Skin Assessment: I have examined the patient's skin and I agree with the wound assessment as performed by the wound care RN as outlined below:    Consultants:  Orthopedics  Procedures:  None  Antimicrobials:  Anti-infectives (From admission, onward)    None         Subjective: Patient seen and examined.  Still complains of left leg pain but appears to have much improved.  Objective: Vitals:   01/19/23 2108 01/20/23 0443 01/20/23 0500 01/20/23 1245  BP: 112/75 112/67  131/69  Pulse: (!) 101 87  74  Resp: 16 15  16   Temp: 98.7 F (37.1 C) (!) 97.5 F (36.4 C)  97.6 F (36.4 C)  TempSrc: Oral     SpO2: 98% 97%  98%  Weight:   85.2 kg   Height:        Intake/Output Summary (Last 24 hours) at 01/20/2023 1407 Last data filed at 01/20/2023 1230 Gross per 24 hour  Intake 360  ml  Output 900 ml  Net -540 ml   Filed Weights   01/18/23 0453 01/19/23 0424 01/20/23 0500  Weight: 83 kg 85 kg 85.2 kg    Examination:  General exam: Appears calm and comfortable  Respiratory system: Clear to auscultation. Respiratory effort normal. Cardiovascular system: S1 & S2 heard, RRR. No JVD, murmurs, rubs, gallops or clicks. No pedal edema. Gastrointestinal system: Abdomen is nondistended, soft and nontender. No organomegaly or masses felt. Normal bowel sounds heard. Central nervous system: Alert and oriented. No focal neurological deficits. Extremities: Restricted range of motion left lower extremity due to pain. Skin: No rashes, lesions or ulcers.  Psychiatry: Judgement and insight appear normal. Mood & affect appropriate.    Data Reviewed: I have personally reviewed following labs and imaging studies  CBC: Recent Labs  Lab 01/14/23 0728 01/15/23 0858  WBC 4.7 4.9  HGB 13.4 13.2  HCT 38.5 37.7  MCV 91.4 91.1  PLT 154 163   Basic Metabolic Panel: Recent Labs  Lab 01/14/23 0728 01/18/23 0950  NA 137 131*  K 4.1 4.0  CL 105 99  CO2 25 24  GLUCOSE 97 123*  BUN 17 15  CREATININE 0.58 0.65  CALCIUM 8.6* 8.3*   GFR: Estimated Creatinine Clearance: 79.1 mL/min (by C-G formula based on SCr of 0.65 mg/dL). Liver Function Tests: No results for input(s): "AST", "ALT", "ALKPHOS", "BILITOT", "PROT", "ALBUMIN" in the last 168 hours.  No results for input(s): "LIPASE", "AMYLASE" in the last 168 hours. No results for input(s): "AMMONIA" in the last 168 hours. Coagulation Profile: No results for input(s): "INR", "PROTIME" in the last 168 hours. Cardiac Enzymes: No results for input(s): "CKTOTAL", "CKMB", "CKMBINDEX", "TROPONINI" in the last 168 hours. BNP (last 3 results) No results for input(s): "PROBNP" in the last 8760 hours. HbA1C: No results for input(s): "HGBA1C" in the last 72 hours. CBG: No results for input(s): "GLUCAP" in the last 168 hours. Lipid  Profile: No results for input(s): "CHOL", "HDL", "LDLCALC", "TRIG", "CHOLHDL", "LDLDIRECT" in the last 72 hours. Thyroid Function Tests: No results for input(s): "TSH", "T4TOTAL", "FREET4", "T3FREE", "THYROIDAB" in the last 72 hours. Anemia Panel: No results for input(s): "VITAMINB12", "FOLATE", "FERRITIN", "TIBC", "IRON", "RETICCTPCT" in the last 72 hours. Sepsis Labs: No results for input(s): "PROCALCITON", "LATICACIDVEN" in the last 168 hours.  No results found for this or any previous visit (from the past 240 hour(s)).   Radiology Studies: No results found.  Scheduled Meds:  acetaminophen  650 mg Oral BID   enoxaparin (LOVENOX) injection  40 mg Subcutaneous Daily   levothyroxine  50 mcg Oral Q0600   lidocaine  1 patch Transdermal Q24H   mirabegron ER  50 mg Oral QHS   oxyCODONE  10 mg Oral Q12H   pantoprazole  20 mg Oral Daily   senna  1 tablet Oral QHS   Continuous Infusions:   LOS: 6 days   Hughie Closs, MD [facility]  01/20/2023, 2:07 PM   *Please note that this is a verbal dictation therefore any spelling or grammatical  errors are due to the "Dragon Medical One" system interpretation.  Please page via Amion and do not message via secure chat for urgent patient care matters. Secure chat can be used for non urgent patient care matters.  How to contact the [facility] Attending or Consulting provider 7A - 7P or covering provider during after hours 7P -7A, for this patient?  Check the care team in [facility] and look for a) attending/consulting TRH provider listed and b) the [facility] team listed. Page or secure chat 7A-7P. Log into [email or web address] and use [place]'s universal password to access. If you do not have the password, please contact the hospital operator. Locate the [facility] provider you are looking for under [facility] and page to a number that you can be directly reached. If you still have difficulty reaching the provider, please page the [facility] (Director on Call) for  the Hospitalists listed on amion for assistance.

## 2023-01-21 DIAGNOSIS — S76392A Other specified injury of muscle, fascia and tendon of the posterior muscle group at thigh level, left thigh, initial encounter: Secondary | ICD-10-CM | POA: Diagnosis not present

## 2023-01-21 DIAGNOSIS — W010XXA Fall on same level from slipping, tripping and stumbling without subsequent striking against object, initial encounter: Secondary | ICD-10-CM | POA: Diagnosis not present

## 2023-01-21 DIAGNOSIS — E039 Hypothyroidism, unspecified: Secondary | ICD-10-CM | POA: Diagnosis not present

## 2023-01-21 LAB — CBC WITH DIFFERENTIAL/PLATELET
Abs Immature Granulocytes: 0.02 10*3/uL (ref 0.00–0.07)
Basophils Absolute: 0 10*3/uL (ref 0.0–0.1)
Basophils Relative: 0 %
Eosinophils Absolute: 0.1 10*3/uL (ref 0.0–0.5)
Eosinophils Relative: 3 %
HCT: 35.1 % — ABNORMAL LOW (ref 36.0–46.0)
Hemoglobin: 12.4 g/dL (ref 12.0–15.0)
Immature Granulocytes: 0 %
Lymphocytes Relative: 32 %
Lymphs Abs: 1.5 10*3/uL (ref 0.7–4.0)
MCH: 31.4 pg (ref 26.0–34.0)
MCHC: 35.3 g/dL (ref 30.0–36.0)
MCV: 88.9 fL (ref 80.0–100.0)
Monocytes Absolute: 0.4 10*3/uL (ref 0.1–1.0)
Monocytes Relative: 9 %
Neutro Abs: 2.5 10*3/uL (ref 1.7–7.7)
Neutrophils Relative %: 56 %
Platelets: 184 10*3/uL (ref 150–400)
RBC: 3.95 MIL/uL (ref 3.87–5.11)
RDW: 11.7 % (ref 11.5–15.5)
WBC: 4.6 10*3/uL (ref 4.0–10.5)
nRBC: 0 % (ref 0.0–0.2)

## 2023-01-21 LAB — BASIC METABOLIC PANEL
Anion gap: 8 (ref 5–15)
BUN: 12 mg/dL (ref 8–23)
CO2: 25 mmol/L (ref 22–32)
Calcium: 8.4 mg/dL — ABNORMAL LOW (ref 8.9–10.3)
Chloride: 98 mmol/L (ref 98–111)
Creatinine, Ser: 0.64 mg/dL (ref 0.44–1.00)
GFR, Estimated: >60 mL/min (ref >60)
Glucose, Bld: 108 mg/dL — ABNORMAL HIGH (ref 70–99)
Potassium: 3.5 mmol/L (ref 3.5–5.1)
Sodium: 131 mmol/L — ABNORMAL LOW (ref 135–145)

## 2023-01-21 MED ORDER — HYDROMORPHONE HCL 1 MG/ML IJ SOLN
0.5000 mg | INTRAMUSCULAR | Status: DC | PRN
  Administered 2023-01-21 – 2023-01-23 (×9): 0.5 mg via INTRAVENOUS
  Filled 2023-01-21 (×10): qty 0.5

## 2023-01-21 MED ORDER — OXYCODONE HCL 5 MG PO TABS
5.0000 mg | ORAL_TABLET | ORAL | Status: DC | PRN
  Administered 2023-01-21 – 2023-01-22 (×3): 5 mg via ORAL
  Filled 2023-01-21 (×3): qty 1

## 2023-01-21 NOTE — Progress Notes (Signed)
PROGRESS NOTE    Christina Howell  ZOX:[id number] DOB: 1955/05/11 DOA: 01/12/2023 PCP: [name], MD   Brief Narrative:  This 67 y.o. female with PMH significant of acquired hypothyroidism, GERD, who presented in the ED with acute left hip pain after ground-level mechanical , Patient has been complaining of acute left hip pain after falling while bowling. Initial x-ray workup was negative in the emergency department but due to intractable pain and difficulty with ambulation , Patient was admitted for further evaluation. After thorough evaluation MRI imaging revealed complete avulsion of the left hamstring tendon with 3 to 4 cm of tendon retraction.  Case was discussed with Dr. [name] with orthopedic surgery who recommended crutches, weightbearing as tolerated and outpatient evaluation by orthopedic surgery in 1 week.     Assessment & Plan:   Active Problems:   Hypokalemia   Acquired hypothyroidism   Urinary incontinence   GERD (gastroesophageal reflux disease)   Avulsion of hamstring muscle, left, initial encounter   Fall from slipping, initial encounter  Fall from slipping leading to avulsion of hamstring muscle, left, initial encounter: Dr.Dalldorf with orthopedic surgery had evaluated the patient on 01/14/2023. Plan is for titration of opiate based analgesics and slow gradual attempts to get the patient out of bed using a rolling walker. However, due to continued pain despite of the extreme pain regime, we have changed her to long-acting oxycodone 10 mg twice daily along with short acting oxycodone every 3 hours with IV Dilaudid and Dilaudid was reduced to 0.25 mg per her significant other's request right in front of her.  Per her request, she was seen by Dr. Priscille Loveless again on 01/19/2023, he yet again recommended no surgical intervention.  Yesterday patient was very comfortable and pain was very well-controlled.  Overnight, she claimed that her pain was worse and was in tears.  This  morning she was upset for reducing her Dilaudid to 0.25 mg as needed and she believes that is not working for her and wanted me to increase that back to 0.5 mg.  I respectfully reminded her that the dose was reduced based on her partner's request in front of her and she also concurred with him at that moment.  She also asked if I can reach out to orthopedics to see her 1 more time.  I told her that likely orthopedic doc is not working this weekend but she is welcome to ask the nurses to page him directly.  Also reassured her that will not change anything since the plan is already clarified by him when he saw her a few days ago.  I also had a frank conversation with her that patient's anxiety is basically major component of her not being able to tolerate the pain.  I also once again counseled and reassured her that the pain is expected and will take weeks to get better in the more she moves/work with physical therapy, the faster she will heal.   Hypokalemia: Resolved.   Acquired hypothyroidism: Continuing levothyroxine.   Urinary incontinence: Continue Myrbetriq.   GERD (gastroesophageal reflux disease): Continue Protonix.  Anxiety: Continue Xanax as needed.  DVT prophylaxis: enoxaparin (LOVENOX) injection 40 mg Start: 01/20/23 1230   Code Status: Full Code  Family Communication: Her partner present at bedside.  Plan of care discussed with patient in length and he/she verbalized understanding and agreed with it.  Status is: Inpatient Remains inpatient appropriate because: Medically stable, pending placement.   Estimated body mass index is 28.16 kg/m as calculated  from the following:   Height as of this encounter: 5\' 8"  (1.727 m).   Weight as of this encounter: 84 kg.    Nutritional Assessment: Body mass index is 28.16 kg/m.Marland Kitchen Seen by dietician.  I agree with the assessment and plan as outlined below: Nutrition Status:        . Skin Assessment: I have examined the patient's skin  and I agree with the wound assessment as performed by the wound care RN as outlined below:    Consultants:  Orthopedics  Procedures:  None  Antimicrobials:  Anti-infectives (From admission, onward)    None         Subjective: Before going to the patient, her partner spoke to me privately outside her room.  We had a lengthy discussion about her plan of care.  We also discussed the fact that she has been provided with bed offers and the fact that he is planning to talk to [facility] on Monday to see if she can get a bed over there.  I explained to him that he will need to pick up bed earlier on Monday morning so we can initiate insurance authorization and possibly discharge her by Tuesday.  If they would not select the facility in timely manner, patient may end up staying another week since Thanksgiving break is coming and insurance authorization may be delayed.  Interestingly, patient was in sound sleep before I entered, she then started crying and complaining of pain when I started talking to her.  Her significant other's behavior was better today when he was talking to me outside privately.  Objective: Vitals:   01/20/23 1245 01/20/23 1952 01/21/23 0411 01/21/23 0500  BP: 131/69 (!) 148/68 (!) 123/52   Pulse: 74 99 83   Resp: 16 20 18    Temp: 97.6 F (36.4 C) 97.8 F (36.6 C) 98.2 F (36.8 C)   TempSrc:      SpO2: 98% 95% 95%   Weight:    84 kg  Height:        Intake/Output Summary (Last 24 hours) at 01/21/2023 1143 Last data filed at 01/20/2023 2000 Gross per 24 hour  Intake 240 ml  Output 1900 ml  Net -1660 ml   Filed Weights   01/19/23 0424 01/20/23 0500 01/21/23 0500  Weight: 85 kg 85.2 kg 84 kg    Examination:  General exam: Appears calm and comfortable  Respiratory system: Clear to auscultation. Respiratory effort normal. Cardiovascular system: S1 & S2 heard, RRR. No JVD, murmurs, rubs, gallops or clicks. No pedal  edema. Gastrointestinal system: Abdomen is nondistended, soft and nontender. No organomegaly or masses felt. Normal bowel sounds heard. Central nervous system: Alert and oriented. No focal neurological deficits. Extremities: Limited range of motion of the left lower extremity due to pain. Skin: No rashes, lesions or ulcers.  Psychiatry: Judgement and insight appear normal. Mood & affect appropriate.    Data Reviewed: I have personally reviewed following labs and imaging studies  CBC: Recent Labs  Lab 01/15/23 0858 01/21/23 0922  WBC 4.9 4.6  NEUTROABS  --  2.5  HGB 13.2 12.4  HCT 37.7 35.1*  MCV 91.1 88.9  PLT 163 184   Basic Metabolic Panel: Recent Labs  Lab 01/18/23 0950 01/21/23 0922  NA 131* 131*  K 4.0 3.5  CL 99 98  CO2 24 25  GLUCOSE 123* 108*  BUN 15 12  CREATININE 0.65 0.64  CALCIUM 8.3* 8.4*   GFR: Estimated Creatinine Clearance: 78.5  mL/min (by C-G formula based on SCr of 0.64 mg/dL). Liver Function Tests: No results for input(s): "AST", "ALT", "ALKPHOS", "BILITOT", "PROT", "ALBUMIN" in the last 168 hours.  No results for input(s): "LIPASE", "AMYLASE" in the last 168 hours. No results for input(s): "AMMONIA" in the last 168 hours. Coagulation Profile: No results for input(s): "INR", "PROTIME" in the last 168 hours. Cardiac Enzymes: No results for input(s): "CKTOTAL", "CKMB", "CKMBINDEX", "TROPONINI" in the last 168 hours. BNP (last 3 results) No results for input(s): "PROBNP" in the last 8760 hours. HbA1C: No results for input(s): "HGBA1C" in the last 72 hours. CBG: No results for input(s): "GLUCAP" in the last 168 hours. Lipid Profile: No results for input(s): "CHOL", "HDL", "LDLCALC", "TRIG", "CHOLHDL", "LDLDIRECT" in the last 72 hours. Thyroid Function Tests: No results for input(s): "TSH", "T4TOTAL", "FREET4", "T3FREE", "THYROIDAB" in the last 72 hours. Anemia Panel: No results for input(s): "VITAMINB12", "FOLATE", "FERRITIN", "TIBC", "IRON",  "RETICCTPCT" in the last 72 hours. Sepsis Labs: No results for input(s): "PROCALCITON", "LATICACIDVEN" in the last 168 hours.  No results found for this or any previous visit (from the past 240 hour(s)).   Radiology Studies: No results found.  Scheduled Meds:  acetaminophen  650 mg Oral BID   enoxaparin (LOVENOX) injection  40 mg Subcutaneous Daily   levothyroxine  50 mcg Oral Q0600   lidocaine  1 patch Transdermal Q24H   mirabegron ER  50 mg Oral QHS   oxyCODONE  10 mg Oral Q12H   pantoprazole  20 mg Oral Daily   senna  1 tablet Oral QHS   Continuous Infusions:   LOS: 7 days   Hughie Closs, MD [facility]  01/21/2023, 11:43 AM   *Please note that this is a verbal dictation therefore any spelling or grammatical errors are due to the "Dragon Medical One" system interpretation.  Please page via Amion and do not message via secure chat for urgent patient care matters. Secure chat can be used for non urgent patient care matters.  How to contact the [facility] Attending or Consulting provider 7A - 7P or covering provider during after hours 7P -7A, for this patient?  Check the care team in [facility] and look for a) attending/consulting TRH provider listed and b) the [facility] team listed. Page or secure chat 7A-7P. Log into [email or web address] and use [place]'s universal password to access. If you do not have the password, please contact the hospital operator. Locate the [facility] provider you are looking for under [facility] and page to a number that you can be directly reached. If you still have difficulty reaching the provider, please page the [facility] (Director on Call) for the Hospitalists listed on amion for assistance.

## 2023-01-21 NOTE — Progress Notes (Signed)
Pt tearful this AM stating she woke up with the most pain she's been in since admit, states pain has just got increasingly worse since arrival and is now changing as it's in the entire upper leg knee to hip, where originally it was just in the knee. States that yesterday she could barely tolerate it, wasn't able to eat bc of it and doesn't see how she'll get through today with this level of pain. Gave PRN dilaudid and flexeril that were available. Pt requested this RN contact the provider about level of pain and how it's worsened. Contacted Johann Capers NP on call. Pt states she will discuss it with her dr. during rounds this AM as well. Pt also is requesing that she see an ortho doctor today.

## 2023-01-21 NOTE — Progress Notes (Signed)
Physical Therapy Treatment Patient Details Name: Christina Howell MRN: [id number] DOB: Feb 18, 1956 Today's Date: 01/21/2023   History of Present Illness Patient is a 67 year old female slipped and fell while bowling with family. LLE went into extreme hip flexion with knee extension and landed on Left hip. x rays are negative however pt cannot straighten L LE withotu sever pain in posterior L upper thigh ( hamstring) area. MRI (+) complete avulsion L hamstring tendon-orthopedics consulted-recommend non-op mgmt, f/u as OP, WBAT.    PT Comments  Pt agreeable to working with therapy; able to progress to extended time seated EOB, repeated STS transitions and attempt transfer to [facility] this session (unable to remain seated on BSC d/t pain) Discussed/encouraged pt would benefit from sitting EOB later with husband present, reviewed with pt spouse as well. Will continue to follow, d/c plan remains appropriate.   If plan is discharge home, recommend the following: A lot of help with walking and/or transfers;A lot of help with bathing/dressing/bathroom;Assistance with cooking/housework;Help with stairs or ramp for entrance   Can travel by private vehicle     No  Equipment Recommendations  Rolling walker (2 wheels)    Recommendations for Other Services       Precautions / Restrictions Precautions Precautions: Fall Restrictions Weight Bearing Restrictions: No LLE Weight Bearing: Weight bearing as tolerated     Mobility  Bed Mobility Overal bed mobility: Needs Assistance Bed Mobility: Rolling, Sidelying to Sit Rolling: Supervision Sidelying to sit: Supervision   Sit to supine: HOB elevated, Supervision   General bed mobility comments: supervision for safety, pt is able to roll to R side and progress LEs on and off bed; returns to bed rapidly after attempt to sit on [facility]    Transfers Overall transfer level: Needs assistance Equipment used: Rolling walker (2 wheels) Transfers: Sit to/from Stand,  Bed to chair/wheelchair/BSC Sit to Stand: Min assist, +2 physical assistance, +2 safety/equipment Stand pivot transfers: Min assist, +2 physical assistance, +2 safety/equipment         General transfer comment: repeated STS x4 with SPT Bed>BSC on 4th trial. min assist of 2 to power up to stand and steady with RW. pt unable to sit on BSC d/t pain and immediately returns to bed; pt maintains LLE in knee and hip flexion in standing, unable to lower L foot to floor d/t pain    Ambulation/Gait               General Gait Details: unable d/t pain   Stairs             Wheelchair Mobility     Tilt Bed    Modified Rankin (Stroke Patients Only)       Balance                                            Cognition Arousal: Alert Behavior During Therapy: WFL for tasks assessed/performed, Anxious Overall Cognitive Status: Within Functional Limits for tasks assessed                                 General Comments: pt is anxious regarding pain and progress with mobility within limits of pain        Exercises      General Comments        Pertinent  Vitals/Pain Pain Assessment Pain Assessment: Faces Faces Pain Scale: Hurts worst Pain Location: L posterior thigh Pain Descriptors / Indicators: Jabbing, Stabbing Pain Intervention(s): Limited activity within patient's tolerance, Monitored during session, Premedicated before session    Home Living                          Prior Function            PT Goals (current goals can now be found in the care plan section) Acute Rehab PT Goals Patient Stated Goal: I want this to be better to get back to my independent self PT Goal Formulation: With patient Time For Goal Achievement: 01/27/23 Potential to Achieve Goals: Good Progress towards PT goals: Progressing toward goals    Frequency    Min 1X/week      PT Plan      Co-evaluation PT/OT/SLP  Co-Evaluation/Treatment: Yes Reason for Co-Treatment: To address functional/ADL transfers PT goals addressed during session: Mobility/safety with mobility;Balance;Proper use of DME        AM-PAC PT "6 Clicks" Mobility   Outcome Measure  Help needed turning from your back to your side while in a flat bed without using bedrails?: A Little Help needed moving from lying on your back to sitting on the side of a flat bed without using bedrails?: A Little Help needed moving to and from a bed to a chair (including a wheelchair)?: A Little Help needed standing up from a chair using your arms (e.g., wheelchair or bedside chair)?: A Little Help needed to walk in hospital room?: Total Help needed climbing 3-5 steps with a railing? : Total 6 Click Score: 14    End of Session Equipment Utilized During Treatment: Gait belt Activity Tolerance: Patient limited by pain Patient left: in bed;with call bell/phone within reach;with bed alarm set   PT Visit Diagnosis: Difficulty in walking, not elsewhere classified (R26.2);Pain Pain - Right/Left: Left Pain - part of body: Leg     Time: 1140-1208 PT Time Calculation (min) (ACUTE ONLY): 28 min  Charges:    $Therapeutic Activity: 8-22 mins PT General Charges $$ ACUTE PT VISIT: 1 Visit                     Firmin Belisle, PT  Acute Rehab Dept [facility]) [phone]  01/21/2023    [facility] 01/21/2023, 12:56 PM

## 2023-01-21 NOTE — Plan of Care (Signed)
  Problem: Education: Goal: Knowledge of General Education information will improve Description: Including pain rating scale, medication(s)/side effects and non-pharmacologic comfort measures Outcome: Progressing   Problem: Health Behavior/Discharge Planning: Goal: Ability to manage health-related needs will improve Outcome: Progressing   Problem: Clinical Measurements: Goal: Ability to maintain clinical measurements within normal limits will improve Outcome: Progressing   Problem: Activity: Goal: Risk for activity intolerance will decrease Outcome: Not Progressing   Problem: Coping: Goal: Level of anxiety will decrease Outcome: Not Progressing   Problem: Pain Management: Goal: General experience of comfort will improve Outcome: Not Progressing

## 2023-01-21 NOTE — Plan of Care (Signed)
  Problem: Clinical Measurements: Goal: Will remain free from infection Outcome: Progressing Goal: Respiratory complications will improve Outcome: Progressing Goal: Cardiovascular complication will be avoided Outcome: Progressing   Problem: Nutrition: Goal: Adequate nutrition will be maintained Outcome: Progressing   Problem: Safety: Goal: Ability to remain free from injury will improve Outcome: Progressing   Problem: Skin Integrity: Goal: Risk for impaired skin integrity will decrease Outcome: Progressing   Problem: Education: Goal: Knowledge of General Education information will improve Description: Including pain rating scale, medication(s)/side effects and non-pharmacologic comfort measures Outcome: Not Progressing   Problem: Health Behavior/Discharge Planning: Goal: Ability to manage health-related needs will improve Outcome: Not Progressing   Problem: Activity: Goal: Risk for activity intolerance will decrease Outcome: Not Progressing   Problem: Coping: Goal: Level of anxiety will decrease Outcome: Not Progressing   Problem: Pain Management: Goal: General experience of comfort will improve Outcome: Not Progressing

## 2023-01-21 NOTE — Progress Notes (Signed)
Occupational Therapy Treatment Patient Details Name: Christina Howell MRN: [id number] DOB: August 16, 1955 Today's Date: 01/21/2023   History of present illness Patient is a 67 year old female slipped and fell while bowling with family. LLE went into extreme hip flexion with knee extension and landed on Left hip. x rays are negative however pt cannot straighten L LE withotu sever pain in posterior L upper thigh ( hamstring) area. MRI (+) complete avulsion L hamstring tendon-orthopedics consulted-recommend non-op mgmt, f/u as OP, WBAT.   OT comments  Patient made progress during session. Increased emphasis/education on function over exeresis was provided during session. Patient was able to engage in standing at EOB for 4 trials with patient able to transfer to [facility] with min A+2 with increased time and patient unable to remain sitting on BSC with increased pain and transitioned back to bed. Patient was educated don sitting up on EOB with nursing or friend later on this date to increase time upright. Patient was also educated on cervical and scapular movements to prevent soreness from being in bed with kyphotic posture.Patient will benefit from continued inpatient follow up therapy, <3 hours/day.        If plan is discharge home, recommend the following:  Two people to help with walking and/or transfers;A lot of help with bathing/dressing/bathroom;Assistance with cooking/housework;Direct supervision/assist for medications management;Assist for transportation;Help with stairs or ramp for entrance   Equipment Recommendations  Wheelchair cushion (measurements OT);Wheelchair (measurements OT);BSC/3in1       Precautions / Restrictions Precautions Precautions: Fall Restrictions Weight Bearing Restrictions: No LLE Weight Bearing: Weight bearing as tolerated       Mobility Bed Mobility Overal bed mobility: Needs Assistance Bed Mobility: Rolling, Sidelying to Sit Rolling: Supervision Sidelying to sit:  Supervision   Sit to supine: HOB elevated, Supervision   General bed mobility comments: supervision for safety, pt is able to roll to R side and progress LEs on and off bed; returns to bed rapidly after attempt to sit on BSC    Transfers Overall transfer level: Needs assistance Equipment used: Rolling walker (2 wheels) Transfers: Sit to/from Stand, Bed to chair/wheelchair/BSC Sit to Stand: Min assist, +2 physical assistance, +2 safety/equipment Stand pivot transfers: Min assist, +2 physical assistance, +2 safety/equipment         General transfer comment: repeated STS x4 with SPT Bed>BSC on 4th trial. min assist of 2 to power up to stand and steady with RW. pt unable to sit on BSC d/t pain and immediately returns to bed; pt maintains LLE in knee and hip flexion in standing, unable to lower L foot to floor d/t pain     Balance Overall balance assessment: Mild deficits observed, not formally tested           ADL either performed or assessed with clinical judgement   ADL Overall ADL's : Needs assistance/impaired       Toilet Transfer: +2 for physical assistance;+2 for safety/equipment;Moderate assistance;Stand-pivot;Rolling walker (2 wheels);BSC/3in1 Toilet Transfer Details (indicate cue type and reason): to [facility] next to bed. patient unable to sit for long than 2 seconds prior to standing back up and hopping back to bed. patient unable to relax LLE enough to get toes on the floor.           General ADL Comments: patient was educated on cervical ROM to maintain posture and prevent sorness in neck wtih noted kyphotic posture at bed level with multiple pillows behind head. patient verbalized understanding. patient was able to sit on EOB  holding LLE up with BUE for about 10 mins with sit to stand attempts inbetween. patient tearful at times during session with personal therapy dog present during session.      Cognition Arousal: Alert Behavior During Therapy: WFL for tasks  assessed/performed, Anxious Overall Cognitive Status: Within Functional Limits for tasks assessed         General Comments: pt is anxious regarding pain and progress with mobility within limits of pain                   Pertinent Vitals/ Pain       Pain Assessment Pain Assessment: Faces Faces Pain Scale: Hurts worst Pain Location: L posterior thigh Pain Descriptors / Indicators: Jabbing, Stabbing Pain Intervention(s): Limited activity within patient's tolerance, Monitored during session, Repositioned, Premedicated before session         Frequency  Min 1X/week        Progress Toward Goals  OT Goals(current goals can now be found in the care plan section)  Progress towards OT goals: Progressing toward goals     Plan      Co-evaluation    PT/OT/SLP Co-Evaluation/Treatment: Yes Reason for Co-Treatment: To address functional/ADL transfers PT goals addressed during session: Mobility/safety with mobility;Balance;Proper use of DME OT goals addressed during session: ADL's and self-care;Proper use of Adaptive equipment and DME      AM-PAC OT "6 Clicks" Daily Activity     Outcome Measure   Help from another person eating meals?: None Help from another person taking care of personal grooming?: A Little Help from another person toileting, which includes using toliet, bedpan, or urinal?: A Lot Help from another person bathing (including washing, rinsing, drying)?: A Lot Help from another person to put on and taking off regular upper body clothing?: A Little Help from another person to put on and taking off regular lower body clothing?: A Lot 6 Click Score: 16    End of Session Equipment Utilized During Treatment: Gait belt;Rolling walker (2 wheels)  OT Visit Diagnosis: Pain;Muscle weakness (generalized) (M62.81)   Activity Tolerance Patient limited by pain   Patient Left in bed;with call bell/phone within reach   Nurse Communication Mobility status;Patient  requests pain meds        Time: 1140-1208 OT Time Calculation (min): 28 min  Charges: OT General Charges $OT Visit: 1 Visit OT Treatments $Therapeutic Activity: 8-22 mins  Rosalio Loud, MS Acute Rehabilitation Department Office# [phone]   Selinda Flavin 01/21/2023, 1:08 PM

## 2023-01-22 DIAGNOSIS — E039 Hypothyroidism, unspecified: Secondary | ICD-10-CM | POA: Diagnosis not present

## 2023-01-22 DIAGNOSIS — W010XXA Fall on same level from slipping, tripping and stumbling without subsequent striking against object, initial encounter: Secondary | ICD-10-CM | POA: Diagnosis not present

## 2023-01-22 MED ORDER — LEVOTHYROXINE SODIUM 50 MCG PO TABS: 50.0000 ug | ORAL_TABLET | ORAL | Status: DC

## 2023-01-22 MED ORDER — LEVOTHYROXINE SODIUM 75 MCG PO TABS
75.0000 ug | ORAL_TABLET | ORAL | Status: DC
  Administered 2023-01-23: 75 ug via ORAL
  Filled 2023-01-22: qty 1

## 2023-01-22 NOTE — Progress Notes (Signed)
Physical Therapy Treatment Patient Details Name: Christina Howell MRN: [id number] DOB: 1955-08-09 Today's Date: 01/22/2023   History of Present Illness Patient is a 67 year old female slipped and fell while bowling with family. LLE went into extreme hip flexion with knee extension and landed on Left hip. x rays are negative however pt cannot straighten L LE withotu sever pain in posterior L upper thigh ( hamstring) area. MRI (+) complete avulsion L hamstring tendon-orthopedics consulted-recommend non-op mgmt, f/u as OP, WBAT.    PT Comments  Pt stood at edge of bed with RW for ~2 minutes, then for ~20 seconds, she holds LLE with hip and knee in flexed position, she was unable to completely lower her LLE into a toe touch position 2* 8-9/10 pain.     If plan is discharge home, recommend the following: A lot of help with walking and/or transfers;A lot of help with bathing/dressing/bathroom;Assistance with cooking/housework;Help with stairs or ramp for entrance   Can travel by private vehicle     No  Equipment Recommendations  Rolling walker (2 wheels)    Recommendations for Other Services       Precautions / Restrictions Precautions Precautions: Fall Restrictions Weight Bearing Restrictions: No LLE Weight Bearing: Weight bearing as tolerated Other Position/Activity Restrictions: avoid full knee extension and end range hip flexion     Mobility  Bed Mobility   Bed Mobility: Rolling, Sidelying to Sit Rolling: Supervision, Used rails Sidelying to sit: Supervision, HOB elevated, Used rails   Sit to supine: HOB elevated, Supervision, Used rails   General bed mobility comments: supervision for safety, pt is able to roll to R side and progress LEs on and off bed; returns to bed rapidly    Transfers Overall transfer level: Needs assistance Equipment used: Rolling walker (2 wheels) Transfers: Sit to/from Stand Sit to Stand: Contact guard assist           General transfer comment:  sit to stand x 2, pt stood ~2 minutes first attempt, then ~20 seconds second attempt, pt holds LLE with hip and knee flexed, she attempted to extend hip/knee but was not able to lower LLE to toe touch position on floor 2* pain. Assist required to steady RW.    Ambulation/Gait                   Stairs             Wheelchair Mobility     Tilt Bed    Modified Rankin (Stroke Patients Only)       Balance Overall balance assessment: Mild deficits observed, not formally tested                                          Cognition Arousal: Alert Behavior During Therapy: WFL for tasks assessed/performed Overall Cognitive Status: Within Functional Limits for tasks assessed                                          Exercises      General Comments        Pertinent Vitals/Pain Pain Assessment Pain Score: 9  Pain Location: L posterior thigh Pain Descriptors / Indicators: Jabbing, Stabbing Pain Intervention(s): Limited activity within patient's tolerance, Monitored during session, Premedicated before session, Heat applied  Home Living                          Prior Function            PT Goals (current goals can now be found in the care plan section) Acute Rehab PT Goals Patient Stated Goal: I want this to be better to get back to my independent self PT Goal Formulation: With patient Time For Goal Achievement: 01/27/23 Potential to Achieve Goals: Good Progress towards PT goals: Progressing toward goals    Frequency    Min 1X/week      PT Plan      Co-evaluation   Reason for Co-Treatment: To address functional/ADL transfers PT goals addressed during session: Mobility/safety with mobility;Balance;Proper use of DME OT goals addressed during session: ADL's and self-care;Proper use of Adaptive equipment and DME      AM-PAC PT "6 Clicks" Mobility   Outcome Measure  Help needed turning from your back to  your side while in a flat bed without using bedrails?: A Little Help needed moving from lying on your back to sitting on the side of a flat bed without using bedrails?: A Little Help needed moving to and from a bed to a chair (including a wheelchair)?: A Little Help needed standing up from a chair using your arms (e.g., wheelchair or bedside chair)?: A Little Help needed to walk in hospital room?: Total Help needed climbing 3-5 steps with a railing? : Total 6 Click Score: 14    End of Session Equipment Utilized During Treatment: Gait belt Activity Tolerance: Patient limited by pain Patient left: in bed;with call bell/phone within reach;with bed alarm set Nurse Communication: Mobility status PT Visit Diagnosis: Difficulty in walking, not elsewhere classified (R26.2);Pain Pain - Right/Left: Left Pain - part of body: Leg     Time: 1318-1340 PT Time Calculation (min) (ACUTE ONLY): 22 min  Charges:    $Therapeutic Activity: 8-22 mins PT General Charges $$ ACUTE PT VISIT: 1 Visit                     Tamala Ser PT 01/22/2023  Acute Rehabilitation Services  Office [phone]

## 2023-01-22 NOTE — Progress Notes (Signed)
PT Cancellation Note  Patient Details Name: Christina Howell MRN: [id number] DOB: [birth year]   Cancelled Treatment:    Reason Eval/Treat Not Completed: Fatigue/lethargy limiting ability to participate (pt stated she just fell asleep and needs to rest right now, she requested PT check back at a later time. Will follow.)   Tamala Ser PT 01/22/2023  Acute Rehabilitation Services  Office [phone]

## 2023-01-22 NOTE — Plan of Care (Signed)
  Problem: Clinical Measurements: Goal: Will remain free from infection Outcome: Progressing   Problem: Activity: Goal: Risk for activity intolerance will decrease Outcome: Progressing   Problem: Coping: Goal: Level of anxiety will decrease Outcome: Progressing   Problem: Elimination: Goal: Will not experience complications related to bowel motility Outcome: Progressing   Problem: Pain Management: Goal: General experience of comfort will improve Outcome: Progressing

## 2023-01-22 NOTE — Progress Notes (Signed)
Better pain control and slept most of night.  Able to stand yesterday and hopefully will take some steps today.  Still encourage nonoperative management.

## 2023-01-22 NOTE — TOC Progression Note (Signed)
Transition of Care [facility]) - Progression Note    Patient Details  Name: DEYSY FURMANSKI MRN: [id number] Date of Birth: January 24, 1956  Transition of [facility]) CM/SW Contact  Amada Jupiter, LCSW Phone Number: 01/22/2023, 1:17 PM  Clinical Narrative:    Have informed pt that I have spoken with Lennie Odor at [facility] and they are not able to offer a bed to her.  Reviewed SNF bed offers again with pt and she has accepted bed at [facility].  Will begin insurance authorization.   Expected Discharge Plan: Home/Self Care Barriers to Discharge: Continued Medical Work up  Expected Discharge Plan and Services In-house Referral: Clinical Social Work   Post Acute Care Choice: Durable Medical Equipment Living arrangements for the past 2 months: Single Family Home                 DME Arranged: Walker rolling DME Agency: [employer] Date DME Agency Contacted: 01/14/23 Time DME Agency Contacted: 1431 Representative spoke with at DME Agency: Selena Batten             Social Determinants of Health (SDOH) Interventions SDOH Screenings   Food Insecurity: No Food Insecurity (01/13/2023)  Housing: Low Risk  (01/13/2023)  Transportation Needs: No Transportation Needs (01/13/2023)  Utilities: Not At Risk (01/13/2023)  Social Connections: Unknown (07/12/2021)   Received from [facility], [facility]  Tobacco Use: Low Risk  (01/13/2023)    Readmission Risk Interventions     No data to display

## 2023-01-22 NOTE — Progress Notes (Signed)
PROGRESS NOTE    Christina Howell  WUJ:[id number] DOB: [birth year] DOA: 01/12/2023 PCP: [name], MD   Brief Narrative:  This 67 y.o. female with PMH significant of acquired hypothyroidism, GERD, who presented in the ED with acute left hip pain after ground-level mechanical , Patient has been complaining of acute left hip pain after falling while bowling. Initial x-ray workup was negative in the emergency department but due to intractable pain and difficulty with ambulation , Patient was admitted for further evaluation. After thorough evaluation MRI imaging revealed complete avulsion of the left hamstring tendon with 3 to 4 cm of tendon retraction.  Case was discussed with Dr. [name] with orthopedic surgery who recommended crutches, weightbearing as tolerated and outpatient evaluation by orthopedic surgery in 1 week.     Assessment & Plan:   Active Problems:   Hypokalemia   Acquired hypothyroidism   Urinary incontinence   GERD (gastroesophageal reflux disease)   Avulsion of hamstring muscle, left, initial encounter   Fall from slipping, initial encounter  Fall from slipping leading to avulsion of hamstring muscle, left, initial encounter: Dr.Dalldorf with orthopedic surgery had evaluated the patient on 01/14/2023. Plan is for titration of opiate based analgesics and slow gradual attempts to get the patient out of bed using a rolling walker. However, due to continued pain despite of the extreme pain regime, we have changed her to long-acting oxycodone 10 mg twice daily along with short acting oxycodone every 3 hours with IV Dilaudid and Dilaudid was reduced to 0.25 mg per her significant other's request right in front of her.  Per her request, she was seen by Dr. Priscille Loveless again on 01/19/2023, he yet again recommended no surgical intervention.  On 01/20/2023 patient was very comfortable and pain was very well-controlled but yet again complained of worsening pain on 01/21/2023.  She was  again seen by orthopedics on 01/22/2023, she slept very well all night long but when I saw her today after orthopedics, she was yet again complaining of pain.  She has been seen by PT OT, they have recommended SNF.  [facility] has provided bed offers but her partner wants her to go to friend's home which has not offered the bed.  He told the [facility] that he has some contacts and he is going to meet with them today to see if she will be offered a bed.  I had lengthy discussion with the partner yesterday and encouraged him to pick a facility as soon as possible on the Monday morning so we can start insurance authorization and possibly discharge by Tuesday, or else due to holidays coming in, she may end up staying in the hospital for the whole week.  No changes in pain medications today.   Hypokalemia: Resolved.   Acquired hypothyroidism: Continuing levothyroxine.   Urinary incontinence: Continue Myrbetriq.   GERD (gastroesophageal reflux disease): Continue Protonix.  Anxiety: Continue Xanax as needed.  DVT prophylaxis: enoxaparin (LOVENOX) injection 40 mg Start: 01/20/23 1230   Code Status: Full Code  Family Communication: Her partner present at bedside.  Plan of care discussed with patient in length and he/she verbalized understanding and agreed with it.  Status is: Inpatient Remains inpatient appropriate because: Medically stable, pending placement.   Estimated body mass index is 28.16 kg/m as calculated from the following:   Height as of this encounter: 5\' 8"  (1.727 m).   Weight as of this encounter: 84 kg.    Nutritional Assessment: Body mass index is 28.16 kg/m.Marland Kitchen Seen by dietician.  I agree with the assessment and plan as outlined below: Nutrition Status:        . Skin Assessment: I have examined the patient's skin and I agree with the wound assessment as performed by the wound care RN as outlined below:    Consultants:  Orthopedics  Procedures:  None  Antimicrobials:   Anti-infectives (From admission, onward)    None         Subjective: Seen and examined.  Please see above.  Objective: Vitals:   01/21/23 0500 01/21/23 1230 01/21/23 2002 01/22/23 0615  BP:  (!) 150/89 125/81 (!) 119/59  Pulse:  83 97 83  Resp:  16 18 18   Temp:  97.9 F (36.6 C) 97.8 F (36.6 C) (!) 97.5 F (36.4 C)  TempSrc:  Oral Oral Oral  SpO2:  93% 99% 96%  Weight: 84 kg     Height:        Intake/Output Summary (Last 24 hours) at 01/22/2023 1049 Last data filed at 01/22/2023 0900 Gross per 24 hour  Intake 1010 ml  Output 650 ml  Net 360 ml   Filed Weights   01/19/23 0424 01/20/23 0500 01/21/23 0500  Weight: 85 kg 85.2 kg 84 kg    Examination:  General exam: Appears calm and comfortable  Respiratory system: Clear to auscultation. Respiratory effort normal. Cardiovascular system: S1 & S2 heard, RRR. No JVD, murmurs, rubs, gallops or clicks. No pedal edema. Gastrointestinal system: Abdomen is nondistended, soft and nontender. No organomegaly or masses felt. Normal bowel sounds heard. Central nervous system: Alert and oriented. No focal neurological deficits. Extremities: Intentional Limited range of motion in the left lower extremity pain Skin: No rashes, lesions or ulcers.  Psychiatry: Judgement and insight appear normal. Mood & affect appropriate.   Data Reviewed: I have personally reviewed following labs and imaging studies  CBC: Recent Labs  Lab 01/21/23 0922  WBC 4.6  NEUTROABS 2.5  HGB 12.4  HCT 35.1*  MCV 88.9  PLT 184   Basic Metabolic Panel: Recent Labs  Lab 01/18/23 0950 01/21/23 0922  NA 131* 131*  K 4.0 3.5  CL 99 98  CO2 24 25  GLUCOSE 123* 108*  BUN 15 12  CREATININE 0.65 0.64  CALCIUM 8.3* 8.4*   GFR: Estimated Creatinine Clearance: 78.5 mL/min (by C-G formula based on SCr of 0.64 mg/dL). Liver Function Tests: No results for input(s): "AST", "ALT", "ALKPHOS", "BILITOT", "PROT", "ALBUMIN" in the last 168 hours.  No  results for input(s): "LIPASE", "AMYLASE" in the last 168 hours. No results for input(s): "AMMONIA" in the last 168 hours. Coagulation Profile: No results for input(s): "INR", "PROTIME" in the last 168 hours. Cardiac Enzymes: No results for input(s): "CKTOTAL", "CKMB", "CKMBINDEX", "TROPONINI" in the last 168 hours. BNP (last 3 results) No results for input(s): "PROBNP" in the last 8760 hours. HbA1C: No results for input(s): "HGBA1C" in the last 72 hours. CBG: No results for input(s): "GLUCAP" in the last 168 hours. Lipid Profile: No results for input(s): "CHOL", "HDL", "LDLCALC", "TRIG", "CHOLHDL", "LDLDIRECT" in the last 72 hours. Thyroid Function Tests: No results for input(s): "TSH", "T4TOTAL", "FREET4", "T3FREE", "THYROIDAB" in the last 72 hours. Anemia Panel: No results for input(s): "VITAMINB12", "FOLATE", "FERRITIN", "TIBC", "IRON", "RETICCTPCT" in the last 72 hours. Sepsis Labs: No results for input(s): "PROCALCITON", "LATICACIDVEN" in the last 168 hours.  No results found for this or any previous visit (from the past 240 hour(s)).   Radiology Studies: No results found.  Scheduled Meds:  acetaminophen  650 mg Oral BID   enoxaparin (LOVENOX) injection  40 mg Subcutaneous Daily   levothyroxine  50 mcg Oral Q0600   lidocaine  1 patch Transdermal Q24H   mirabegron ER  50 mg Oral QHS   oxyCODONE  10 mg Oral Q12H   pantoprazole  20 mg Oral Daily   senna  1 tablet Oral QHS   Continuous Infusions:   LOS: 8 days   Hughie Closs, MD [facility]  01/22/2023, 10:49 AM   *Please note that this is a verbal dictation therefore any spelling or grammatical errors are due to the "[employer] One" system interpretation.  Please page via Amion and do not message via secure chat for urgent patient care matters. Secure chat can be used for non urgent patient care matters.  How to contact the [facility] Attending or Consulting provider 7A - 7P or covering provider during after  hours 7P -7A, for this patient?  Check the care team in [facility] and look for a) attending/consulting [facility] provider listed and b) the [facility] team listed. Page or secure chat 7A-7P. Log into [email or web address] and use [facility] universal password to access. If you do not have the password, please contact the hospital operator. Locate the [facility] provider you are looking for under [facility] and page to a number that you can be directly reached. If you still have difficulty reaching the provider, please page the [facility] (Director on Call) for the Hospitalists listed on amion for assistance.

## 2023-01-23 DIAGNOSIS — F419 Anxiety disorder, unspecified: Secondary | ICD-10-CM | POA: Diagnosis not present

## 2023-01-23 DIAGNOSIS — M6281 Muscle weakness (generalized): Secondary | ICD-10-CM | POA: Diagnosis not present

## 2023-01-23 DIAGNOSIS — F4322 Adjustment disorder with anxiety: Secondary | ICD-10-CM | POA: Diagnosis not present

## 2023-01-23 DIAGNOSIS — R52 Pain, unspecified: Secondary | ICD-10-CM | POA: Diagnosis not present

## 2023-01-23 DIAGNOSIS — Z719 Counseling, unspecified: Secondary | ICD-10-CM | POA: Diagnosis not present

## 2023-01-23 DIAGNOSIS — Z7401 Bed confinement status: Secondary | ICD-10-CM | POA: Diagnosis not present

## 2023-01-23 DIAGNOSIS — I959 Hypotension, unspecified: Secondary | ICD-10-CM | POA: Diagnosis not present

## 2023-01-23 DIAGNOSIS — E039 Hypothyroidism, unspecified: Secondary | ICD-10-CM | POA: Diagnosis not present

## 2023-01-23 DIAGNOSIS — K219 Gastro-esophageal reflux disease without esophagitis: Secondary | ICD-10-CM | POA: Diagnosis not present

## 2023-01-23 DIAGNOSIS — S76312A Strain of muscle, fascia and tendon of the posterior muscle group at thigh level, left thigh, initial encounter: Secondary | ICD-10-CM | POA: Diagnosis not present

## 2023-01-23 DIAGNOSIS — R102 Pelvic and perineal pain: Secondary | ICD-10-CM | POA: Diagnosis not present

## 2023-01-23 DIAGNOSIS — X58XXXA Exposure to other specified factors, initial encounter: Secondary | ICD-10-CM | POA: Diagnosis not present

## 2023-01-23 DIAGNOSIS — M79605 Pain in left leg: Secondary | ICD-10-CM | POA: Diagnosis not present

## 2023-01-23 DIAGNOSIS — R262 Difficulty in walking, not elsewhere classified: Secondary | ICD-10-CM | POA: Diagnosis not present

## 2023-01-23 DIAGNOSIS — T50905A Adverse effect of unspecified drugs, medicaments and biological substances, initial encounter: Secondary | ICD-10-CM | POA: Diagnosis not present

## 2023-01-23 DIAGNOSIS — R451 Restlessness and agitation: Secondary | ICD-10-CM | POA: Diagnosis not present

## 2023-01-23 DIAGNOSIS — W010XXA Fall on same level from slipping, tripping and stumbling without subsequent striking against object, initial encounter: Secondary | ICD-10-CM | POA: Diagnosis not present

## 2023-01-23 DIAGNOSIS — I10 Essential (primary) hypertension: Secondary | ICD-10-CM | POA: Diagnosis not present

## 2023-01-23 DIAGNOSIS — Z9181 History of falling: Secondary | ICD-10-CM | POA: Diagnosis not present

## 2023-01-23 DIAGNOSIS — F413 Other mixed anxiety disorders: Secondary | ICD-10-CM | POA: Diagnosis not present

## 2023-01-23 DIAGNOSIS — S76392A Other specified injury of muscle, fascia and tendon of the posterior muscle group at thigh level, left thigh, initial encounter: Secondary | ICD-10-CM | POA: Diagnosis not present

## 2023-01-23 DIAGNOSIS — R32 Unspecified urinary incontinence: Secondary | ICD-10-CM | POA: Diagnosis not present

## 2023-01-23 DIAGNOSIS — F430 Acute stress reaction: Secondary | ICD-10-CM | POA: Diagnosis not present

## 2023-01-23 DIAGNOSIS — F410 Panic disorder [episodic paroxysmal anxiety] without agoraphobia: Secondary | ICD-10-CM | POA: Diagnosis not present

## 2023-01-23 DIAGNOSIS — F5102 Adjustment insomnia: Secondary | ICD-10-CM | POA: Diagnosis not present

## 2023-01-23 DIAGNOSIS — S76392D Other specified injury of muscle, fascia and tendon of the posterior muscle group at thigh level, left thigh, subsequent encounter: Secondary | ICD-10-CM | POA: Diagnosis not present

## 2023-01-23 MED ORDER — OXYCODONE HCL ER 10 MG PO T12A: 10.0000 mg | EXTENDED_RELEASE_TABLET | Freq: Two times a day (BID) | ORAL | 0 refills | Status: AC

## 2023-01-23 MED ORDER — ALPRAZOLAM 0.25 MG PO TABS: 0.2500 mg | ORAL_TABLET | Freq: Two times a day (BID) | ORAL | 0 refills | Status: AC | PRN

## 2023-01-23 MED ORDER — OXYCODONE HCL 5 MG PO TABS: 5.0000 mg | ORAL_TABLET | Freq: Four times a day (QID) | ORAL | 0 refills | Status: AC | PRN

## 2023-01-23 NOTE — Discharge Summary (Signed)
Physician Discharge Summary  Christina Howell WGN:[id number] DOB: 10/24/1955 DOA: 01/12/2023  PCP: [name], MD  Admit date: 01/12/2023 Discharge date: 01/23/2023 30 Day Unplanned Readmission Risk Score    Flowsheet Row ED to Hosp-Admission (Current) from 01/12/2023 in [facility]  30 Day Unplanned Readmission Risk Score (%) 8.55 Filed at 01/23/2023 0801       This score is the patient's risk of an unplanned readmission within 30 days of being discharged (0 -100%). The score is based on dignosis, age, lab data, medications, orders, and past utilization.   Low:  0-14.9   Medium: 15-21.9   High: 22-29.9   Extreme: 30 and above          Admitted From: Home Disposition: SNF  Recommendations for Outpatient Follow-up:  Follow up with PCP in 1-2 weeks Please obtain BMP/CBC in one week Follow-up with orthopedics in 2 weeks Please follow up with your PCP on the following pending results: Unresulted Labs (From admission, onward)    None         Home Health: None Equipment/Devices: None  Discharge Condition: Stable CODE STATUS: Full code Diet recommendation: Regular  Subjective: Seen and examined.  Pain in the left lower extremity is improving.  She is in agreement with the discharge plan to SNF today.  Brief/Interim Summary: This 67 y.o. female with PMH significant of acquired hypothyroidism, GERD, who presented in the ED with acute left hip pain after ground-level mechanical , Patient has been complaining of acute left hip pain after falling while bowling. Initial x-ray workup was negative in the emergency department but due to intractable pain and difficulty with ambulation , Patient was admitted for further evaluation. After thorough evaluation MRI imaging revealed complete avulsion of the left hamstring tendon with 3 to 4 cm of tendon retraction.  Patient was seen by Dr. [name] with orthopedic surgery multiple times due to  intractable and constant pain without relief.  He continued to recommend nonsurgical management, mobility and pain control.  Multiple regimens were tried for the pain with the patient however patient's anxiety was barrier for her to tolerate any amount of pain despite of extensive and aggressive opioid pain management.  It was very hard for the physical therapy to get her to work with them but eventually she did work with them and PT recommended SNF.  Patient has now selected to the facility, insurance authorization has been obtained, she is being discharged to SNF today for further rehabilitation in stable condition and she is in agreement with that.     Hypokalemia: Resolved.   Acquired hypothyroidism: Continuing levothyroxine.   Urinary incontinence: Continue Myrbetriq.   GERD (gastroesophageal reflux disease): Continue Protonix.   Anxiety: Continue Xanax as needed.  Discharge plan was discussed with patient and/or family member and they verbalized understanding and agreed with it.  Discharge Diagnoses:  Active Problems:   Hypokalemia   Acquired hypothyroidism   Urinary incontinence   GERD (gastroesophageal reflux disease)   Avulsion of hamstring muscle, left, initial encounter   Fall from slipping, initial encounter    Discharge Instructions   Allergies as of 01/23/2023       Reactions   Diphenhydramine Hcl Other (See Comments)   hyper        Medication List     STOP taking these medications    baclofen 10 MG tablet Commonly known as: LIORESAL   diclofenac 75 MG EC tablet Commonly known as: VOLTAREN   estradiol 0.025  MG/24HR Commonly known as: VIVELLE-DOT   estradiol 0.5 MG tablet Commonly known as: ESTRACE   fluocinonide cream 0.05 % Commonly known as: LIDEX   methocarbamol 500 MG tablet Commonly known as: Robaxin   nabumetone 750 MG tablet Commonly known as: RELAFEN   naproxen 500 MG tablet Commonly known as: NAPROSYN       TAKE these  medications    ALPRAZolam 0.25 MG tablet Commonly known as: XANAX Take 1 tablet (0.25 mg total) by mouth 2 (two) times daily as needed for up to 10 days for anxiety.   celecoxib 200 MG capsule Commonly known as: CELEBREX Take 1 capsule by mouth daily.   cyclobenzaprine 10 MG tablet Commonly known as: FLEXERIL Take 1 tablet (10 mg total) by mouth 3 (three) times daily as needed for muscle spasms.   levothyroxine 50 MCG tablet Commonly known as: SYNTHROID TAKE 1 TABLET BY MOUTH ON EMPTY STOMACH IN MORNING EVERY OTHER DAY ALTERNATING WITH   levothyroxine 75 MCG tablet Commonly known as: SYNTHROID TAKE 1 TABLET BY MOUTH ON AN EMPTY STOMACH IN MORNING EVERY OTHER DAY ALTERNATING WITH   Myrbetriq 50 MG Tb24 tablet Generic drug: mirabegron ER Take 50 mg by mouth daily.   oxyCODONE 10 mg 12 hr tablet Commonly known as: OXYCONTIN Take 1 tablet (10 mg total) by mouth every 12 (twelve) hours.   oxyCODONE 5 MG immediate release tablet Commonly known as: Oxy IR/ROXICODONE Take 1 tablet (5 mg total) by mouth every 6 (six) hours as needed for breakthrough pain.   pantoprazole 40 MG tablet Commonly known as: PROTONIX Take 40 mg by mouth daily. What changed: Another medication with the same name was removed. Continue taking this medication, and follow the directions you see here.               Durable Medical Equipment  (From admission, onward)           Start     Ordered   01/14/23 1407  DME Walker rolling  (Discharge Planning)  Once       Question Answer Comment  Walker: With 5 Inch Wheels   Patient needs a walker to treat with the following condition Avulsion of hamstring muscle      01/14/23 1406            Contact information for follow-up providers     [name], MD Follow up in 1 week(s).   Specialty: Family Medicine Contact information: [address] [place] [address] [phone]         Marcene Corning, MD Follow up in  2 week(s).   Specialty: Orthopedic Surgery Contact information: [address] [place] [address] [phone]              Contact information for after-discharge care     Destination     [facility], [facility] Preferred SNF .   Service: Skilled Nursing Contact information: [address] [place] [address] [phone]                    Allergies  Allergen Reactions   Diphenhydramine Hcl Other (See Comments)    hyper    Consultations: Orthopedics   Procedures/Studies: MR FEMUR LEFT WO CONTRAST  Result Date: 01/13/2023 CLINICAL DATA:  Upper leg pain, stress fracture suspected, neg xray Lower extremity trauma, penetrating Left hamstring tendon area pain EXAM: MR OF THE LEFT FEMUR WITHOUT CONTRAST TECHNIQUE: Multiplanar, multisequence MR imaging of the left  femur was performed. No intravenous contrast was administered. COMPARISON:  X-ray 01/12/2023 FINDINGS: Bones/Joint/Cartilage Left femur intact without fracture or dislocation. No significant arthropathy of the left hip or knee. No left hip joint effusion. No avulsion fracture of the ischial tuberosity. No bone marrow edema. No marrow replacing bone lesion. Ligaments Intact. Muscles and Tendons Complete avulsion of the tendons at the left hamstring tendon origin with 3-4 cm of tendon retraction. The left gluteal iliopsoas, rectus femoris, and adductor tendons appear intact without tear or significant tendinosis. Tendinous structures about the knee are intact. Intramuscular edema within the proximal hamstring and adductor musculature. Soft tissues Prominent fluid and edema surrounds the site of left hamstring tendon origin tear. In total, fluid collection measures approximately 15 x 5 x 5 cm. Remainder of the soft tissues are otherwise unremarkable. No left inguinal lymphadenopathy. IMPRESSION: 1. Complete avulsion of the left hamstring tendon origin with 3-4 cm of tendon  retraction. 2. Prominent fluid and edema surrounds the site of left hamstring tendon origin tear. In total, fluid collection measures approximately 15 x 5 x 5 cm. 3. Left femur intact without fracture or dislocation. Electronically Signed   By: Duanne Guess D.O.   On: 01/13/2023 17:54   DG Femur 1V Left  Result Date: 01/12/2023 CLINICAL DATA:  Left thigh pain EXAM: LEFT FEMUR 1 VIEW COMPARISON:  None Available. FINDINGS: There is no evidence of fracture or other focal bone lesions. Soft tissues are unremarkable. IMPRESSION: Negative. Electronically Signed   By: Jasmine Pang M.D.   On: 01/12/2023 22:00   CT PELVIS WO CONTRAST  Result Date: 01/12/2023 CLINICAL DATA:  Trauma. EXAM: CT PELVIS WITHOUT CONTRAST TECHNIQUE: Multidetector CT imaging of the pelvis was performed following the standard protocol without intravenous contrast. RADIATION DOSE REDUCTION: This exam was performed according to the departmental dose-optimization program which includes automated exposure control, adjustment of the mA and/or kV according to patient size and/or use of iterative reconstruction technique. COMPARISON:  None Available. FINDINGS: Urinary Tract:  No abnormality visualized. Bowel: Unremarkable visualized pelvic bowel loops. Normal appendix. 41 Vascular/Lymphatic: No pathologically enlarged lymph nodes. Prominent superficial vessels in the right inguinal region consistent with collateralization. Reproductive: No mass or other significant abnormality. Status post hysterectomy. Other:  None. Musculoskeletal: No evidence of fracture. No suspicious bone lesions identified. Facet joint degenerative changes at the lumbosacral junction. IMPRESSION: No acute pelvic pathology identified. Electronically Signed   By: Layla Maw M.D.   On: 01/12/2023 19:33   DG Hip Unilat With Pelvis 2-3 Views Left  Result Date: 01/12/2023 CLINICAL DATA:  Fall, pain. EXAM: DG HIP (WITH OR WITHOUT PELVIS) 3V LEFT COMPARISON:   05/13/2018. FINDINGS: There is no evidence of hip fracture or dislocation. There is no evidence of arthropathy or other focal bone abnormality. There are lumbosacral degenerative changes. IMPRESSION: Lumbosacral degenerative changes.  No acute osseous abnormalities. Electronically Signed   By: Layla Maw M.D.   On: 01/12/2023 19:28   MM 3D SCREENING MAMMOGRAM BILATERAL BREAST  Result Date: 01/03/2023 CLINICAL DATA:  Screening. EXAM: DIGITAL SCREENING BILATERAL MAMMOGRAM WITH TOMOSYNTHESIS AND CAD TECHNIQUE: Bilateral screening digital craniocaudal and mediolateral oblique mammograms were obtained. Bilateral screening digital breast tomosynthesis was performed. The images were evaluated with computer-aided detection. COMPARISON:  Previous exam(s). ACR Breast Density Category c: The breasts are heterogeneously dense, which may obscure small masses. FINDINGS: There are no findings suspicious for malignancy. IMPRESSION: No mammographic evidence of malignancy. A result letter of this screening mammogram will be mailed directly to the  patient. RECOMMENDATION: Screening mammogram in one year. (Code:[id number]-B-01Y) BI-RADS CATEGORY  1: Negative. Electronically Signed   By: Sherian Rein M.D.   On: 01/03/2023 13:52     Discharge Exam: Vitals:   01/22/23 2333 01/23/23 0522  BP: (!) 152/93 116/82  Pulse: 89 87  Resp: 18 16  Temp: 97.8 F (36.6 C) 98 F (36.7 C)  SpO2: 93% 95%   Vitals:   01/22/23 2005 01/22/23 2333 01/23/23 0522 01/23/23 0604  BP: (!) 143/66 (!) 152/93 116/82   Pulse: 85 89 87   Resp: 16 18 16    Temp: (!) 97.5 F (36.4 C) 97.8 F (36.6 C) 98 F (36.7 C)   TempSrc: Oral Oral Oral   SpO2: 99% 93% 95%   Weight:    83 kg  Height:        General: Pt is alert, awake, not in acute distress Cardiovascular: RRR, S1/S2 +, no rubs, no gallops Respiratory: CTA bilaterally, no wheezing, no rhonchi Abdominal: Soft, NT, ND, bowel sounds + Extremities: Limited motion of the left lower  extremity due to pain.    The results of significant diagnostics from this hospitalization (including imaging, microbiology, ancillary and laboratory) are listed below for reference.     Microbiology: No results found for this or any previous visit (from the past 240 hour(s)).   Labs: BNP (last 3 results) No results for input(s): "BNP" in the last 8760 hours. Basic Metabolic Panel: Recent Labs  Lab 01/18/23 0950 01/21/23 0922  NA 131* 131*  K 4.0 3.5  CL 99 98  CO2 24 25  GLUCOSE 123* 108*  BUN 15 12  CREATININE 0.65 0.64  CALCIUM 8.3* 8.4*   Liver Function Tests: No results for input(s): "AST", "ALT", "ALKPHOS", "BILITOT", "PROT", "ALBUMIN" in the last 168 hours. No results for input(s): "LIPASE", "AMYLASE" in the last 168 hours. No results for input(s): "AMMONIA" in the last 168 hours. CBC: Recent Labs  Lab 01/21/23 0922  WBC 4.6  NEUTROABS 2.5  HGB 12.4  HCT 35.1*  MCV 88.9  PLT 184   Cardiac Enzymes: No results for input(s): "CKTOTAL", "CKMB", "CKMBINDEX", "TROPONINI" in the last 168 hours. BNP: Invalid input(s): "POCBNP" CBG: No results for input(s): "GLUCAP" in the last 168 hours. D-Dimer No results for input(s): "DDIMER" in the last 72 hours. Hgb A1c No results for input(s): "HGBA1C" in the last 72 hours. Lipid Profile No results for input(s): "CHOL", "HDL", "LDLCALC", "TRIG", "CHOLHDL", "LDLDIRECT" in the last 72 hours. Thyroid function studies No results for input(s): "TSH", "T4TOTAL", "T3FREE", "THYROIDAB" in the last 72 hours.  Invalid input(s): "FREET3" Anemia work up No results for input(s): "VITAMINB12", "FOLATE", "FERRITIN", "TIBC", "IRON", "RETICCTPCT" in the last 72 hours. Urinalysis No results found for: "COLORURINE", "APPEARANCEUR", "LABSPEC", "PHURINE", "GLUCOSEU", "HGBUR", "BILIRUBINUR", "KETONESUR", "PROTEINUR", "UROBILINOGEN", "NITRITE", "LEUKOCYTESUR" Sepsis Labs Recent Labs  Lab 01/21/23 0922  WBC 4.6   Microbiology No  results found for this or any previous visit (from the past 240 hour(s)).  FURTHER DISCHARGE INSTRUCTIONS:   Get Medicines reviewed and adjusted: Please take all your medications with you for your next visit with your Primary MD   Laboratory/radiological data: Please request your Primary MD to go over all hospital tests and procedure/radiological results at the follow up, please ask your Primary MD to get all Hospital records sent to his/her office.   In some cases, they will be blood work, cultures and biopsy results pending at the time of your discharge. Please request that your primary care M.D.  goes through all the records of your hospital data and follows up on these results.   Also Note the following: If you experience worsening of your admission symptoms, develop shortness of breath, life threatening emergency, suicidal or homicidal thoughts you must seek medical attention immediately by calling 911 or calling your MD immediately  if symptoms less severe.   You must read complete instructions/literature along with all the possible adverse reactions/side effects for all the Medicines you take and that have been prescribed to you. Take any new Medicines after you have completely understood and accpet all the possible adverse reactions/side effects.    Do not drive when taking Pain medications or sleeping medications (Benzodaizepines)   Do not take more than prescribed Pain, Sleep and Anxiety Medications. It is not advisable to combine anxiety,sleep and pain medications without talking with your primary care practitioner   Special Instructions: If you have smoked or chewed Tobacco  in the last 2 yrs please stop smoking, stop any regular Alcohol  and or any Recreational drug use.   Wear Seat belts while driving.   Please note: You were cared for by a hospitalist during your hospital stay. Once you are discharged, your primary care physician will handle any further medical issues. Please  note that NO REFILLS for any discharge medications will be authorized once you are discharged, as it is imperative that you return to your primary care physician (or establish a relationship with a primary care physician if you do not have one) for your post hospital discharge needs so that they can reassess your need for medications and monitor your lab values  Time coordinating discharge: Over 30 minutes  SIGNED:   Hughie Closs, MD  [facility] 01/23/2023, 10:33 AM *Please note that this is a verbal dictation therefore any spelling or grammatical errors are due to the "[employer] One" system interpretation. If 7PM-7AM, please contact night-coverage [email or web address]

## 2023-01-23 NOTE — Progress Notes (Signed)
Occupational Therapy Treatment Patient Details Name: Christina Howell MRN: [id number] DOB: 11-Nov-1955 Today's Date: 01/23/2023   History of present illness Patient is a 67 year old female slipped and fell while bowling with family. LLE went into extreme hip flexion with knee extension and landed on Left hip. x rays are negative however pt cannot straighten L LE withotu sever pain in posterior L upper thigh ( hamstring) area. MRI (+) complete avulsion L hamstring tendon-orthopedics consulted-recommend non-op mgmt, f/u as OP, WBAT.   OT comments  Pt hopeful to discharge today for rehab, as she is motivated to return to PLOF. Pt participates in ADL session seated EOB focusing in education, improving tolerance to sitting upright, and performing sit <> stand transitions with RW. Oral care with setup, bed mobility supervision, stands with CGA + RW, keeping L hip + knee flexed and unable to extend to touch toe on floor. OT will continue POC for functional gains. Patient will benefit from continued inpatient follow up therapy, <3 hours/day       If plan is discharge home, recommend the following:  Two people to help with walking and/or transfers;A lot of help with bathing/dressing/bathroom;Assistance with cooking/housework;Direct supervision/assist for medications management;Assist for transportation;Help with stairs or ramp for entrance   Equipment Recommendations  Wheelchair cushion (measurements OT);Wheelchair (measurements OT);BSC/3in1    Recommendations for Other Services      Precautions / Restrictions Precautions Precautions: Fall Restrictions Weight Bearing Restrictions: Yes LLE Weight Bearing: Weight bearing as tolerated Other Position/Activity Restrictions: avoid full knee extension and end range hip flexion       Mobility Bed Mobility Overal bed mobility: Needs Assistance Bed Mobility: Supine to Sit, Sit to Supine   Sidelying to sit: Supervision, HOB elevated, Used rails Supine to  sit: Supervision, Min assist Sit to supine: HOB elevated, Supervision, Used rails   General bed mobility comments: pt returns to bed quickly after standing bout, able to use B elbows and bridge at hips to scoot upwards in bed    Transfers Overall transfer level: Needs assistance Equipment used: Rolling walker (2 wheels) Transfers: Sit to/from Stand Sit to Stand: Contact guard assist           General transfer comment: sit to stand performed with CGA, pt holds LLE with hip + knee flex, she is unable to extend hip or knee to floor to touch due to pain. OT provides assistance to steady RW for transition     Balance Overall balance assessment: Mild deficits observed, not formally tested                                         ADL either performed or assessed with clinical judgement   ADL Overall ADL's : Needs assistance/impaired     Grooming: Oral care;Set up;Sitting                               Functional mobility during ADLs: Minimal assistance;Rolling walker (2 wheels) General ADL Comments: Pt performing seated ADL oral care sitting EOB with setup, tolerates sitting EOB for ~10 mins with 1  bout of standing, minA with OT supporting RW. OT provided education on LB AE (hip kit) and discussed use of AE for increased independence in LB ADLs.      Cognition Arousal: Alert Behavior During Therapy: WFL for tasks assessed/performed Overall  Cognitive Status: Within Functional Limits for tasks assessed                                                     Pertinent Vitals/ Pain       Pain Assessment Pain Assessment: 0-10 Pain Score: 5  Pain Location: L posterior thigh Pain Descriptors / Indicators: Jabbing, Stabbing Pain Intervention(s): Limited activity within patient's tolerance, Monitored during session, Premedicated before session   Frequency  Min 1X/week        Progress Toward Goals  OT Goals(current goals can  now be found in the care plan section)  Progress towards OT goals: Progressing toward goals  Acute Rehab OT Goals OT Goal Formulation: With patient Time For Goal Achievement: 01/29/23 Potential to Achieve Goals: Fair  Plan         AM-PAC OT "6 Clicks" Daily Activity     Outcome Measure   Help from another person eating meals?: None Help from another person taking care of personal grooming?: A Little Help from another person toileting, which includes using toliet, bedpan, or urinal?: A Lot Help from another person bathing (including washing, rinsing, drying)?: A Lot Help from another person to put on and taking off regular upper body clothing?: A Little Help from another person to put on and taking off regular lower body clothing?: A Lot 6 Click Score: 16    End of Session Equipment Utilized During Treatment: Gait belt;Rolling walker (2 wheels)  OT Visit Diagnosis: Pain;Muscle weakness (generalized) (M62.81) Pain - Right/Left: Left Pain - part of body: Knee (L hamstring)   Activity Tolerance Patient limited by pain   Patient Left in bed;with call bell/phone within reach;with bed alarm set   Nurse Communication          Time: 1610-9604 OT Time Calculation (min): 30 min  Charges: OT General Charges $OT Visit: 1 Visit OT Treatments $Self Care/Home Management : 23-37 mins  Ari Engelbrecht L. Anyjah Roundtree, OTR/L  01/23/23, 11:17 AM

## 2023-01-23 NOTE — Plan of Care (Signed)
  Problem: Education: Goal: Knowledge of General Education information will improve Description: Including pain rating scale, medication(s)/side effects and non-pharmacologic comfort measures Outcome: Progressing   Problem: Health Behavior/Discharge Planning: Goal: Ability to manage health-related needs will improve Outcome: Progressing   Problem: Clinical Measurements: Goal: Will remain free from infection Outcome: Progressing   Problem: Activity: Goal: Risk for activity intolerance will decrease Outcome: Progressing   Problem: Nutrition: Goal: Adequate nutrition will be maintained Outcome: Progressing   Problem: Coping: Goal: Level of anxiety will decrease Outcome: Progressing   Problem: Pain Management: Goal: General experience of comfort will improve Outcome: Progressing   Problem: Safety: Goal: Ability to remain free from injury will improve Outcome: Progressing   Problem: Skin Integrity: Goal: Risk for impaired skin integrity will decrease Outcome: Progressing

## 2023-01-23 NOTE — Plan of Care (Signed)
Pt is A&O x 4. VSS, on room air. C/o severe pain to left leg and left thigh. PRN IV dilaudid, scheduled oxycodone given as ordered. Ice applied to left leg.  Bed alarm on. Call bell in reach. Will continue to monitor.   Problem: Education: Goal: Knowledge of General Education information will improve Description: Including pain rating scale, medication(s)/side effects and non-pharmacologic comfort measures Outcome: Progressing   Problem: Health Behavior/Discharge Planning: Goal: Ability to manage health-related needs will improve Outcome: Progressing   Problem: Clinical Measurements: Goal: Ability to maintain clinical measurements within normal limits will improve Outcome: Progressing Goal: Will remain free from infection Outcome: Progressing Goal: Diagnostic test results will improve Outcome: Progressing Goal: Respiratory complications will improve Outcome: Progressing Goal: Cardiovascular complication will be avoided Outcome: Progressing   Problem: Activity: Goal: Risk for activity intolerance will decrease Outcome: Progressing   Problem: Nutrition: Goal: Adequate nutrition will be maintained Outcome: Progressing   Problem: Coping: Goal: Level of anxiety will decrease Outcome: Progressing   Problem: Elimination: Goal: Will not experience complications related to bowel motility Outcome: Progressing Goal: Will not experience complications related to urinary retention Outcome: Progressing   Problem: Pain Management: Goal: General experience of comfort will improve Outcome: Progressing   Problem: Safety: Goal: Ability to remain free from injury will improve Outcome: Progressing   Problem: Skin Integrity: Goal: Risk for impaired skin integrity will decrease Outcome: Progressing

## 2023-01-23 NOTE — TOC Transition Note (Signed)
Transition of Care [facility]) - CM/SW Discharge Note   Patient Details  Name: Christina Howell MRN: [id number] Date of Birth: 1956-01-30  Transition of Care [facility]) CM/SW Contact:  Amada Jupiter, LCSW Phone Number: 01/23/2023, 11:32 AM   Clinical Narrative:    Pt medically cleared for dc today to [facility] and have received insurance authorization.  Pt aware and agreeable.  PTAR called at 11:30am.  RN to call report to [phone].  No further TOC needs.   Final next level of care: Skilled Nursing Facility Barriers to Discharge: Barriers Resolved   Patient Goals and CMS Choice CMS Medicare.gov Compare Post Acute Care list provided to:: Patient Choice offered to / list presented to : Patient  Discharge Placement PASRR number recieved: 01/18/23 PASRR number recieved: 01/18/23            Patient chooses bed at: [facility] Patient to be transferred to facility by: PTAR Name of family member notified: s/o Patient and family notified of of transfer: 01/23/23  Discharge Plan and Services Additional resources added to the After Visit Summary for   In-house Referral: Clinical Social Work   Post Acute Care Choice: Durable Medical Equipment          DME Arranged: Dan Humphreys rolling DME Agency: [employer] Date DME Agency Contacted: 01/14/23 Time DME Agency Contacted: [id number] Representative spoke with at DME Agency: Selena Batten            Social Determinants of Health (SDOH) Interventions SDOH Screenings   Food Insecurity: No Food Insecurity (01/13/2023)  Housing: Low Risk  (01/13/2023)  Transportation Needs: No Transportation Needs (01/13/2023)  Utilities: Not At Risk (01/13/2023)  Social Connections: Unknown (07/12/2021)   Received from [facility]  Tobacco Use: Low Risk  (01/13/2023)     Readmission Risk Interventions    01/23/2023   11:30 AM  Readmission Risk Prevention Plan  Post Dischage Appt Complete  Medication Screening Complete  Transportation Screening  Complete

## 2023-01-23 NOTE — Plan of Care (Addendum)
  Problem: Education: Goal: Knowledge of General Education information will improve Description: Including pain rating scale, medication(s)/side effects and non-pharmacologic comfort measures 01/23/2023 1232 by Landrey Mahurin T, RN Outcome: Adequate for Discharge 01/23/2023 1225 by Akil Hoos T, RN Outcome: Progressing   Problem: Health Behavior/Discharge Planning: Goal: Ability to manage health-related needs will improve 01/23/2023 1232 by Gisela Lea T, RN Outcome: Adequate for Discharge 01/23/2023 1225 by Carmelle Bamberg T, RN Outcome: Progressing   Problem: Clinical Measurements: Goal: Ability to maintain clinical measurements within normal limits will improve Outcome: Adequate for Discharge Goal: Will remain free from infection 01/23/2023 1232 by Naeem Quillin T, RN Outcome: Adequate for Discharge 01/23/2023 1225 by Gaspar Fowle T, RN Outcome: Progressing Goal: Diagnostic test results will improve Outcome: Adequate for Discharge Goal: Respiratory complications will improve Outcome: Adequate for Discharge Goal: Cardiovascular complication will be avoided Outcome: Adequate for Discharge   Problem: Activity: Goal: Risk for activity intolerance will decrease 01/23/2023 1232 by Karla Vines T, RN Outcome: Adequate for Discharge 01/23/2023 1225 by Leyani Gargus T, RN Outcome: Progressing   Problem: Nutrition: Goal: Adequate nutrition will be maintained 01/23/2023 1232 by Brentlee Delage T, RN Outcome: Adequate for Discharge 01/23/2023 1225 by Juanito Gonyer T, RN Outcome: Progressing   Problem: Coping: Goal: Level of anxiety will decrease 01/23/2023 1232 by Anddy Wingert T, RN Outcome: Adequate for Discharge 01/23/2023 1225 by Nura Cahoon T, RN Outcome: Progressing   Problem: Elimination: Goal: Will not experience complications related to bowel motility Outcome: Adequate for Discharge Goal: Will not experience complications related to urinary  retention Outcome: Adequate for Discharge   Problem: Pain Management: Goal: General experience of comfort will improve 01/23/2023 1232 by Bennett Vanscyoc T, RN Outcome: Adequate for Discharge 01/23/2023 1225 by Tasfia Vasseur T, RN Outcome: Progressing   Problem: Safety: Goal: Ability to remain free from injury will improve 01/23/2023 1232 by Marianela Mandrell T, RN Outcome: Adequate for Discharge 01/23/2023 1225 by Dekisha Mesmer T, RN Outcome: Progressing   Problem: Skin Integrity: Goal: Risk for impaired skin integrity will decrease 01/23/2023 1232 by Annai Heick T, RN Outcome: Adequate for Discharge 01/23/2023 1225 by Erandy Mceachern T, RN Outcome: Progressing    AVS was printed and given to the medics. The patient left the unit at 12:40 in a stable condition. The patient was transferred to [facility] place room 704 and Report was given to [place] at 12:48.

## 2023-01-24 DIAGNOSIS — S76312A Strain of muscle, fascia and tendon of the posterior muscle group at thigh level, left thigh, initial encounter: Secondary | ICD-10-CM | POA: Diagnosis not present

## 2023-01-24 DIAGNOSIS — R52 Pain, unspecified: Secondary | ICD-10-CM | POA: Diagnosis not present

## 2023-01-24 DIAGNOSIS — E039 Hypothyroidism, unspecified: Secondary | ICD-10-CM | POA: Diagnosis not present

## 2023-01-24 DIAGNOSIS — Z9181 History of falling: Secondary | ICD-10-CM | POA: Diagnosis not present

## 2023-01-24 DIAGNOSIS — R451 Restlessness and agitation: Secondary | ICD-10-CM | POA: Diagnosis not present

## 2023-01-24 DIAGNOSIS — K219 Gastro-esophageal reflux disease without esophagitis: Secondary | ICD-10-CM | POA: Diagnosis not present

## 2023-01-24 DIAGNOSIS — X58XXXA Exposure to other specified factors, initial encounter: Secondary | ICD-10-CM | POA: Diagnosis not present

## 2023-01-24 DIAGNOSIS — S76392D Other specified injury of muscle, fascia and tendon of the posterior muscle group at thigh level, left thigh, subsequent encounter: Secondary | ICD-10-CM | POA: Diagnosis not present

## 2023-01-24 DIAGNOSIS — M79605 Pain in left leg: Secondary | ICD-10-CM | POA: Diagnosis not present

## 2023-01-24 DIAGNOSIS — F419 Anxiety disorder, unspecified: Secondary | ICD-10-CM | POA: Diagnosis not present

## 2023-01-29 DIAGNOSIS — Z9181 History of falling: Secondary | ICD-10-CM | POA: Diagnosis not present

## 2023-01-29 DIAGNOSIS — R52 Pain, unspecified: Secondary | ICD-10-CM | POA: Diagnosis not present

## 2023-01-29 DIAGNOSIS — F419 Anxiety disorder, unspecified: Secondary | ICD-10-CM | POA: Diagnosis not present

## 2023-01-29 DIAGNOSIS — S76392D Other specified injury of muscle, fascia and tendon of the posterior muscle group at thigh level, left thigh, subsequent encounter: Secondary | ICD-10-CM | POA: Diagnosis not present

## 2023-01-31 DIAGNOSIS — Z9181 History of falling: Secondary | ICD-10-CM | POA: Diagnosis not present

## 2023-01-31 DIAGNOSIS — S76392D Other specified injury of muscle, fascia and tendon of the posterior muscle group at thigh level, left thigh, subsequent encounter: Secondary | ICD-10-CM | POA: Diagnosis not present

## 2023-01-31 DIAGNOSIS — R52 Pain, unspecified: Secondary | ICD-10-CM | POA: Diagnosis not present

## 2023-01-31 DIAGNOSIS — F419 Anxiety disorder, unspecified: Secondary | ICD-10-CM | POA: Diagnosis not present

## 2023-02-01 DIAGNOSIS — F430 Acute stress reaction: Secondary | ICD-10-CM | POA: Diagnosis not present

## 2023-02-01 DIAGNOSIS — F410 Panic disorder [episodic paroxysmal anxiety] without agoraphobia: Secondary | ICD-10-CM | POA: Diagnosis not present

## 2023-02-02 DIAGNOSIS — S76392D Other specified injury of muscle, fascia and tendon of the posterior muscle group at thigh level, left thigh, subsequent encounter: Secondary | ICD-10-CM | POA: Diagnosis not present

## 2023-02-02 DIAGNOSIS — K219 Gastro-esophageal reflux disease without esophagitis: Secondary | ICD-10-CM | POA: Diagnosis not present

## 2023-02-02 DIAGNOSIS — Z719 Counseling, unspecified: Secondary | ICD-10-CM | POA: Diagnosis not present

## 2023-02-02 DIAGNOSIS — F410 Panic disorder [episodic paroxysmal anxiety] without agoraphobia: Secondary | ICD-10-CM | POA: Diagnosis not present

## 2023-02-05 DIAGNOSIS — Z9181 History of falling: Secondary | ICD-10-CM | POA: Diagnosis not present

## 2023-02-05 DIAGNOSIS — S76392D Other specified injury of muscle, fascia and tendon of the posterior muscle group at thigh level, left thigh, subsequent encounter: Secondary | ICD-10-CM | POA: Diagnosis not present

## 2023-02-05 DIAGNOSIS — F419 Anxiety disorder, unspecified: Secondary | ICD-10-CM | POA: Diagnosis not present

## 2023-02-05 DIAGNOSIS — R52 Pain, unspecified: Secondary | ICD-10-CM | POA: Diagnosis not present

## 2023-02-06 DIAGNOSIS — S76392D Other specified injury of muscle, fascia and tendon of the posterior muscle group at thigh level, left thigh, subsequent encounter: Secondary | ICD-10-CM | POA: Diagnosis not present

## 2023-02-06 DIAGNOSIS — F410 Panic disorder [episodic paroxysmal anxiety] without agoraphobia: Secondary | ICD-10-CM | POA: Diagnosis not present

## 2023-02-06 DIAGNOSIS — E039 Hypothyroidism, unspecified: Secondary | ICD-10-CM | POA: Diagnosis not present

## 2023-02-06 DIAGNOSIS — K219 Gastro-esophageal reflux disease without esophagitis: Secondary | ICD-10-CM | POA: Diagnosis not present

## 2023-02-07 DIAGNOSIS — F410 Panic disorder [episodic paroxysmal anxiety] without agoraphobia: Secondary | ICD-10-CM | POA: Diagnosis not present

## 2023-02-07 DIAGNOSIS — S76392D Other specified injury of muscle, fascia and tendon of the posterior muscle group at thigh level, left thigh, subsequent encounter: Secondary | ICD-10-CM | POA: Diagnosis not present

## 2023-02-07 DIAGNOSIS — T50905A Adverse effect of unspecified drugs, medicaments and biological substances, initial encounter: Secondary | ICD-10-CM | POA: Diagnosis not present

## 2023-02-07 DIAGNOSIS — R32 Unspecified urinary incontinence: Secondary | ICD-10-CM | POA: Diagnosis not present

## 2023-02-08 DIAGNOSIS — T402X5D Adverse effect of other opioids, subsequent encounter: Secondary | ICD-10-CM | POA: Diagnosis not present

## 2023-02-08 DIAGNOSIS — S76392D Other specified injury of muscle, fascia and tendon of the posterior muscle group at thigh level, left thigh, subsequent encounter: Secondary | ICD-10-CM | POA: Diagnosis not present

## 2023-02-08 DIAGNOSIS — E871 Hypo-osmolality and hyponatremia: Secondary | ICD-10-CM | POA: Diagnosis not present

## 2023-02-08 DIAGNOSIS — I8390 Asymptomatic varicose veins of unspecified lower extremity: Secondary | ICD-10-CM | POA: Diagnosis not present

## 2023-02-08 DIAGNOSIS — F410 Panic disorder [episodic paroxysmal anxiety] without agoraphobia: Secondary | ICD-10-CM | POA: Diagnosis not present

## 2023-02-08 DIAGNOSIS — E039 Hypothyroidism, unspecified: Secondary | ICD-10-CM | POA: Diagnosis not present

## 2023-02-08 DIAGNOSIS — K219 Gastro-esophageal reflux disease without esophagitis: Secondary | ICD-10-CM | POA: Diagnosis not present

## 2023-02-08 DIAGNOSIS — K5903 Drug induced constipation: Secondary | ICD-10-CM | POA: Diagnosis not present

## 2023-02-08 DIAGNOSIS — F430 Acute stress reaction: Secondary | ICD-10-CM | POA: Diagnosis not present

## 2023-02-08 DIAGNOSIS — Z9181 History of falling: Secondary | ICD-10-CM | POA: Diagnosis not present

## 2023-02-09 DIAGNOSIS — M25552 Pain in left hip: Secondary | ICD-10-CM | POA: Diagnosis not present

## 2023-02-12 DIAGNOSIS — K5909 Other constipation: Secondary | ICD-10-CM | POA: Diagnosis not present

## 2023-02-12 DIAGNOSIS — F410 Panic disorder [episodic paroxysmal anxiety] without agoraphobia: Secondary | ICD-10-CM | POA: Diagnosis not present

## 2023-02-12 DIAGNOSIS — K219 Gastro-esophageal reflux disease without esophagitis: Secondary | ICD-10-CM | POA: Diagnosis not present

## 2023-02-12 DIAGNOSIS — E871 Hypo-osmolality and hyponatremia: Secondary | ICD-10-CM | POA: Diagnosis not present

## 2023-02-12 DIAGNOSIS — K5903 Drug induced constipation: Secondary | ICD-10-CM | POA: Diagnosis not present

## 2023-02-12 DIAGNOSIS — F430 Acute stress reaction: Secondary | ICD-10-CM | POA: Diagnosis not present

## 2023-02-12 DIAGNOSIS — I8390 Asymptomatic varicose veins of unspecified lower extremity: Secondary | ICD-10-CM | POA: Diagnosis not present

## 2023-02-12 DIAGNOSIS — E039 Hypothyroidism, unspecified: Secondary | ICD-10-CM | POA: Diagnosis not present

## 2023-02-12 DIAGNOSIS — T402X5D Adverse effect of other opioids, subsequent encounter: Secondary | ICD-10-CM | POA: Diagnosis not present

## 2023-02-12 DIAGNOSIS — Z9181 History of falling: Secondary | ICD-10-CM | POA: Diagnosis not present

## 2023-02-12 DIAGNOSIS — S76392D Other specified injury of muscle, fascia and tendon of the posterior muscle group at thigh level, left thigh, subsequent encounter: Secondary | ICD-10-CM | POA: Diagnosis not present

## 2023-02-13 DIAGNOSIS — Z9181 History of falling: Secondary | ICD-10-CM | POA: Diagnosis not present

## 2023-02-13 DIAGNOSIS — I8390 Asymptomatic varicose veins of unspecified lower extremity: Secondary | ICD-10-CM | POA: Diagnosis not present

## 2023-02-13 DIAGNOSIS — F410 Panic disorder [episodic paroxysmal anxiety] without agoraphobia: Secondary | ICD-10-CM | POA: Diagnosis not present

## 2023-02-13 DIAGNOSIS — S76392D Other specified injury of muscle, fascia and tendon of the posterior muscle group at thigh level, left thigh, subsequent encounter: Secondary | ICD-10-CM | POA: Diagnosis not present

## 2023-02-13 DIAGNOSIS — F430 Acute stress reaction: Secondary | ICD-10-CM | POA: Diagnosis not present

## 2023-02-13 DIAGNOSIS — T402X5D Adverse effect of other opioids, subsequent encounter: Secondary | ICD-10-CM | POA: Diagnosis not present

## 2023-02-13 DIAGNOSIS — E039 Hypothyroidism, unspecified: Secondary | ICD-10-CM | POA: Diagnosis not present

## 2023-02-13 DIAGNOSIS — E871 Hypo-osmolality and hyponatremia: Secondary | ICD-10-CM | POA: Diagnosis not present

## 2023-02-13 DIAGNOSIS — K219 Gastro-esophageal reflux disease without esophagitis: Secondary | ICD-10-CM | POA: Diagnosis not present

## 2023-02-13 DIAGNOSIS — K5903 Drug induced constipation: Secondary | ICD-10-CM | POA: Diagnosis not present

## 2023-02-15 DIAGNOSIS — K5903 Drug induced constipation: Secondary | ICD-10-CM | POA: Diagnosis not present

## 2023-02-15 DIAGNOSIS — Z9181 History of falling: Secondary | ICD-10-CM | POA: Diagnosis not present

## 2023-02-15 DIAGNOSIS — F430 Acute stress reaction: Secondary | ICD-10-CM | POA: Diagnosis not present

## 2023-02-15 DIAGNOSIS — S76392D Other specified injury of muscle, fascia and tendon of the posterior muscle group at thigh level, left thigh, subsequent encounter: Secondary | ICD-10-CM | POA: Diagnosis not present

## 2023-02-15 DIAGNOSIS — K219 Gastro-esophageal reflux disease without esophagitis: Secondary | ICD-10-CM | POA: Diagnosis not present

## 2023-02-15 DIAGNOSIS — T402X5D Adverse effect of other opioids, subsequent encounter: Secondary | ICD-10-CM | POA: Diagnosis not present

## 2023-02-15 DIAGNOSIS — I8390 Asymptomatic varicose veins of unspecified lower extremity: Secondary | ICD-10-CM | POA: Diagnosis not present

## 2023-02-15 DIAGNOSIS — E039 Hypothyroidism, unspecified: Secondary | ICD-10-CM | POA: Diagnosis not present

## 2023-02-15 DIAGNOSIS — F410 Panic disorder [episodic paroxysmal anxiety] without agoraphobia: Secondary | ICD-10-CM | POA: Diagnosis not present

## 2023-02-15 DIAGNOSIS — E871 Hypo-osmolality and hyponatremia: Secondary | ICD-10-CM | POA: Diagnosis not present

## 2023-02-19 DIAGNOSIS — R52 Pain, unspecified: Secondary | ICD-10-CM | POA: Diagnosis not present

## 2023-02-19 DIAGNOSIS — S76392D Other specified injury of muscle, fascia and tendon of the posterior muscle group at thigh level, left thigh, subsequent encounter: Secondary | ICD-10-CM | POA: Diagnosis not present

## 2023-02-22 DIAGNOSIS — F410 Panic disorder [episodic paroxysmal anxiety] without agoraphobia: Secondary | ICD-10-CM | POA: Diagnosis not present

## 2023-02-22 DIAGNOSIS — T402X5D Adverse effect of other opioids, subsequent encounter: Secondary | ICD-10-CM | POA: Diagnosis not present

## 2023-02-22 DIAGNOSIS — Z9181 History of falling: Secondary | ICD-10-CM | POA: Diagnosis not present

## 2023-02-22 DIAGNOSIS — F430 Acute stress reaction: Secondary | ICD-10-CM | POA: Diagnosis not present

## 2023-02-22 DIAGNOSIS — E039 Hypothyroidism, unspecified: Secondary | ICD-10-CM | POA: Diagnosis not present

## 2023-02-22 DIAGNOSIS — E871 Hypo-osmolality and hyponatremia: Secondary | ICD-10-CM | POA: Diagnosis not present

## 2023-02-22 DIAGNOSIS — S76392D Other specified injury of muscle, fascia and tendon of the posterior muscle group at thigh level, left thigh, subsequent encounter: Secondary | ICD-10-CM | POA: Diagnosis not present

## 2023-02-22 DIAGNOSIS — K5903 Drug induced constipation: Secondary | ICD-10-CM | POA: Diagnosis not present

## 2023-02-22 DIAGNOSIS — I8390 Asymptomatic varicose veins of unspecified lower extremity: Secondary | ICD-10-CM | POA: Diagnosis not present

## 2023-02-22 DIAGNOSIS — K219 Gastro-esophageal reflux disease without esophagitis: Secondary | ICD-10-CM | POA: Diagnosis not present

## 2023-02-23 DIAGNOSIS — E039 Hypothyroidism, unspecified: Secondary | ICD-10-CM | POA: Diagnosis not present

## 2023-02-23 DIAGNOSIS — K5903 Drug induced constipation: Secondary | ICD-10-CM | POA: Diagnosis not present

## 2023-02-23 DIAGNOSIS — T402X5D Adverse effect of other opioids, subsequent encounter: Secondary | ICD-10-CM | POA: Diagnosis not present

## 2023-02-23 DIAGNOSIS — S76392D Other specified injury of muscle, fascia and tendon of the posterior muscle group at thigh level, left thigh, subsequent encounter: Secondary | ICD-10-CM | POA: Diagnosis not present

## 2023-02-23 DIAGNOSIS — K219 Gastro-esophageal reflux disease without esophagitis: Secondary | ICD-10-CM | POA: Diagnosis not present

## 2023-02-23 DIAGNOSIS — I8390 Asymptomatic varicose veins of unspecified lower extremity: Secondary | ICD-10-CM | POA: Diagnosis not present

## 2023-02-23 DIAGNOSIS — Z9181 History of falling: Secondary | ICD-10-CM | POA: Diagnosis not present

## 2023-02-23 DIAGNOSIS — F430 Acute stress reaction: Secondary | ICD-10-CM | POA: Diagnosis not present

## 2023-02-23 DIAGNOSIS — F410 Panic disorder [episodic paroxysmal anxiety] without agoraphobia: Secondary | ICD-10-CM | POA: Diagnosis not present

## 2023-02-23 DIAGNOSIS — E871 Hypo-osmolality and hyponatremia: Secondary | ICD-10-CM | POA: Diagnosis not present

## 2023-02-26 DIAGNOSIS — F410 Panic disorder [episodic paroxysmal anxiety] without agoraphobia: Secondary | ICD-10-CM | POA: Diagnosis not present

## 2023-02-26 DIAGNOSIS — Z9181 History of falling: Secondary | ICD-10-CM | POA: Diagnosis not present

## 2023-02-26 DIAGNOSIS — S76392D Other specified injury of muscle, fascia and tendon of the posterior muscle group at thigh level, left thigh, subsequent encounter: Secondary | ICD-10-CM | POA: Diagnosis not present

## 2023-02-26 DIAGNOSIS — E039 Hypothyroidism, unspecified: Secondary | ICD-10-CM | POA: Diagnosis not present

## 2023-02-26 DIAGNOSIS — F430 Acute stress reaction: Secondary | ICD-10-CM | POA: Diagnosis not present

## 2023-02-26 DIAGNOSIS — K219 Gastro-esophageal reflux disease without esophagitis: Secondary | ICD-10-CM | POA: Diagnosis not present

## 2023-02-26 DIAGNOSIS — E871 Hypo-osmolality and hyponatremia: Secondary | ICD-10-CM | POA: Diagnosis not present

## 2023-02-26 DIAGNOSIS — K5903 Drug induced constipation: Secondary | ICD-10-CM | POA: Diagnosis not present

## 2023-02-26 DIAGNOSIS — T402X5D Adverse effect of other opioids, subsequent encounter: Secondary | ICD-10-CM | POA: Diagnosis not present

## 2023-02-26 DIAGNOSIS — I8390 Asymptomatic varicose veins of unspecified lower extremity: Secondary | ICD-10-CM | POA: Diagnosis not present

## 2023-03-02 DIAGNOSIS — Z9181 History of falling: Secondary | ICD-10-CM | POA: Diagnosis not present

## 2023-03-02 DIAGNOSIS — K219 Gastro-esophageal reflux disease without esophagitis: Secondary | ICD-10-CM | POA: Diagnosis not present

## 2023-03-02 DIAGNOSIS — I8390 Asymptomatic varicose veins of unspecified lower extremity: Secondary | ICD-10-CM | POA: Diagnosis not present

## 2023-03-02 DIAGNOSIS — S76392D Other specified injury of muscle, fascia and tendon of the posterior muscle group at thigh level, left thigh, subsequent encounter: Secondary | ICD-10-CM | POA: Diagnosis not present

## 2023-03-02 DIAGNOSIS — K5903 Drug induced constipation: Secondary | ICD-10-CM | POA: Diagnosis not present

## 2023-03-02 DIAGNOSIS — F410 Panic disorder [episodic paroxysmal anxiety] without agoraphobia: Secondary | ICD-10-CM | POA: Diagnosis not present

## 2023-03-02 DIAGNOSIS — E871 Hypo-osmolality and hyponatremia: Secondary | ICD-10-CM | POA: Diagnosis not present

## 2023-03-02 DIAGNOSIS — T402X5D Adverse effect of other opioids, subsequent encounter: Secondary | ICD-10-CM | POA: Diagnosis not present

## 2023-03-02 DIAGNOSIS — E039 Hypothyroidism, unspecified: Secondary | ICD-10-CM | POA: Diagnosis not present

## 2023-03-02 DIAGNOSIS — F430 Acute stress reaction: Secondary | ICD-10-CM | POA: Diagnosis not present

## 2023-03-05 DIAGNOSIS — R52 Pain, unspecified: Secondary | ICD-10-CM | POA: Diagnosis not present

## 2023-03-05 DIAGNOSIS — T402X5D Adverse effect of other opioids, subsequent encounter: Secondary | ICD-10-CM | POA: Diagnosis not present

## 2023-03-05 DIAGNOSIS — I8390 Asymptomatic varicose veins of unspecified lower extremity: Secondary | ICD-10-CM | POA: Diagnosis not present

## 2023-03-05 DIAGNOSIS — S76392D Other specified injury of muscle, fascia and tendon of the posterior muscle group at thigh level, left thigh, subsequent encounter: Secondary | ICD-10-CM | POA: Diagnosis not present

## 2023-03-05 DIAGNOSIS — E871 Hypo-osmolality and hyponatremia: Secondary | ICD-10-CM | POA: Diagnosis not present

## 2023-03-05 DIAGNOSIS — K219 Gastro-esophageal reflux disease without esophagitis: Secondary | ICD-10-CM | POA: Diagnosis not present

## 2023-03-05 DIAGNOSIS — F430 Acute stress reaction: Secondary | ICD-10-CM | POA: Diagnosis not present

## 2023-03-05 DIAGNOSIS — K5903 Drug induced constipation: Secondary | ICD-10-CM | POA: Diagnosis not present

## 2023-03-05 DIAGNOSIS — E039 Hypothyroidism, unspecified: Secondary | ICD-10-CM | POA: Diagnosis not present

## 2023-03-05 DIAGNOSIS — F410 Panic disorder [episodic paroxysmal anxiety] without agoraphobia: Secondary | ICD-10-CM | POA: Diagnosis not present

## 2023-03-05 DIAGNOSIS — Z9181 History of falling: Secondary | ICD-10-CM | POA: Diagnosis not present

## 2023-03-07 DIAGNOSIS — I8390 Asymptomatic varicose veins of unspecified lower extremity: Secondary | ICD-10-CM | POA: Diagnosis not present

## 2023-03-07 DIAGNOSIS — E039 Hypothyroidism, unspecified: Secondary | ICD-10-CM | POA: Diagnosis not present

## 2023-03-07 DIAGNOSIS — K219 Gastro-esophageal reflux disease without esophagitis: Secondary | ICD-10-CM | POA: Diagnosis not present

## 2023-03-07 DIAGNOSIS — S76392D Other specified injury of muscle, fascia and tendon of the posterior muscle group at thigh level, left thigh, subsequent encounter: Secondary | ICD-10-CM | POA: Diagnosis not present

## 2023-03-07 DIAGNOSIS — F410 Panic disorder [episodic paroxysmal anxiety] without agoraphobia: Secondary | ICD-10-CM | POA: Diagnosis not present

## 2023-03-07 DIAGNOSIS — E871 Hypo-osmolality and hyponatremia: Secondary | ICD-10-CM | POA: Diagnosis not present

## 2023-03-07 DIAGNOSIS — T402X5D Adverse effect of other opioids, subsequent encounter: Secondary | ICD-10-CM | POA: Diagnosis not present

## 2023-03-07 DIAGNOSIS — K5903 Drug induced constipation: Secondary | ICD-10-CM | POA: Diagnosis not present

## 2023-03-07 DIAGNOSIS — Z9181 History of falling: Secondary | ICD-10-CM | POA: Diagnosis not present

## 2023-03-07 DIAGNOSIS — F430 Acute stress reaction: Secondary | ICD-10-CM | POA: Diagnosis not present

## 2023-03-12 DIAGNOSIS — R52 Pain, unspecified: Secondary | ICD-10-CM | POA: Diagnosis not present

## 2023-03-12 DIAGNOSIS — M25552 Pain in left hip: Secondary | ICD-10-CM | POA: Diagnosis not present

## 2023-03-12 DIAGNOSIS — S76392D Other specified injury of muscle, fascia and tendon of the posterior muscle group at thigh level, left thigh, subsequent encounter: Secondary | ICD-10-CM | POA: Diagnosis not present

## 2023-03-13 DIAGNOSIS — S76312D Strain of muscle, fascia and tendon of the posterior muscle group at thigh level, left thigh, subsequent encounter: Secondary | ICD-10-CM | POA: Diagnosis not present

## 2023-03-15 DIAGNOSIS — S76312D Strain of muscle, fascia and tendon of the posterior muscle group at thigh level, left thigh, subsequent encounter: Secondary | ICD-10-CM | POA: Diagnosis not present

## 2023-03-15 DIAGNOSIS — L538 Other specified erythematous conditions: Secondary | ICD-10-CM | POA: Diagnosis not present

## 2023-03-15 DIAGNOSIS — L814 Other melanin hyperpigmentation: Secondary | ICD-10-CM | POA: Diagnosis not present

## 2023-03-15 DIAGNOSIS — L821 Other seborrheic keratosis: Secondary | ICD-10-CM | POA: Diagnosis not present

## 2023-03-15 DIAGNOSIS — L820 Inflamed seborrheic keratosis: Secondary | ICD-10-CM | POA: Diagnosis not present

## 2023-03-15 DIAGNOSIS — D225 Melanocytic nevi of trunk: Secondary | ICD-10-CM | POA: Diagnosis not present

## 2023-03-15 DIAGNOSIS — L905 Scar conditions and fibrosis of skin: Secondary | ICD-10-CM | POA: Diagnosis not present

## 2023-03-19 DIAGNOSIS — S76312D Strain of muscle, fascia and tendon of the posterior muscle group at thigh level, left thigh, subsequent encounter: Secondary | ICD-10-CM | POA: Diagnosis not present

## 2023-03-19 DIAGNOSIS — M791 Myalgia, unspecified site: Secondary | ICD-10-CM | POA: Diagnosis not present

## 2023-03-19 DIAGNOSIS — M545 Low back pain, unspecified: Secondary | ICD-10-CM | POA: Diagnosis not present

## 2023-03-22 DIAGNOSIS — S76312D Strain of muscle, fascia and tendon of the posterior muscle group at thigh level, left thigh, subsequent encounter: Secondary | ICD-10-CM | POA: Diagnosis not present

## 2023-03-23 DIAGNOSIS — R52 Pain, unspecified: Secondary | ICD-10-CM | POA: Diagnosis not present

## 2023-03-23 DIAGNOSIS — G2581 Restless legs syndrome: Secondary | ICD-10-CM | POA: Diagnosis not present

## 2023-04-11 DIAGNOSIS — M25552 Pain in left hip: Secondary | ICD-10-CM | POA: Diagnosis not present

## 2023-04-12 DIAGNOSIS — M79605 Pain in left leg: Secondary | ICD-10-CM | POA: Diagnosis not present

## 2023-04-12 DIAGNOSIS — H903 Sensorineural hearing loss, bilateral: Secondary | ICD-10-CM | POA: Diagnosis not present

## 2023-04-12 DIAGNOSIS — Z6826 Body mass index (BMI) 26.0-26.9, adult: Secondary | ICD-10-CM | POA: Diagnosis not present

## 2023-04-12 DIAGNOSIS — H9312 Tinnitus, left ear: Secondary | ICD-10-CM | POA: Diagnosis not present

## 2023-04-12 DIAGNOSIS — M79604 Pain in right leg: Secondary | ICD-10-CM | POA: Diagnosis not present

## 2023-04-12 DIAGNOSIS — S76312D Strain of muscle, fascia and tendon of the posterior muscle group at thigh level, left thigh, subsequent encounter: Secondary | ICD-10-CM | POA: Diagnosis not present

## 2023-04-16 DIAGNOSIS — S76312D Strain of muscle, fascia and tendon of the posterior muscle group at thigh level, left thigh, subsequent encounter: Secondary | ICD-10-CM | POA: Diagnosis not present

## 2023-04-17 DIAGNOSIS — H903 Sensorineural hearing loss, bilateral: Secondary | ICD-10-CM | POA: Diagnosis not present

## 2023-04-17 DIAGNOSIS — H9312 Tinnitus, left ear: Secondary | ICD-10-CM | POA: Diagnosis not present

## 2023-04-17 DIAGNOSIS — K080 Exfoliation of teeth due to systemic causes: Secondary | ICD-10-CM | POA: Diagnosis not present

## 2023-04-19 DIAGNOSIS — S76312D Strain of muscle, fascia and tendon of the posterior muscle group at thigh level, left thigh, subsequent encounter: Secondary | ICD-10-CM | POA: Diagnosis not present

## 2023-04-24 DIAGNOSIS — S76312D Strain of muscle, fascia and tendon of the posterior muscle group at thigh level, left thigh, subsequent encounter: Secondary | ICD-10-CM | POA: Diagnosis not present

## 2023-04-26 DIAGNOSIS — S76312D Strain of muscle, fascia and tendon of the posterior muscle group at thigh level, left thigh, subsequent encounter: Secondary | ICD-10-CM | POA: Diagnosis not present

## 2023-04-30 DIAGNOSIS — S76312D Strain of muscle, fascia and tendon of the posterior muscle group at thigh level, left thigh, subsequent encounter: Secondary | ICD-10-CM | POA: Diagnosis not present

## 2023-05-03 DIAGNOSIS — H00012 Hordeolum externum right lower eyelid: Secondary | ICD-10-CM | POA: Diagnosis not present

## 2023-05-03 DIAGNOSIS — Z6826 Body mass index (BMI) 26.0-26.9, adult: Secondary | ICD-10-CM | POA: Diagnosis not present

## 2023-05-03 DIAGNOSIS — S61411A Laceration without foreign body of right hand, initial encounter: Secondary | ICD-10-CM | POA: Diagnosis not present

## 2023-05-03 DIAGNOSIS — S76312D Strain of muscle, fascia and tendon of the posterior muscle group at thigh level, left thigh, subsequent encounter: Secondary | ICD-10-CM | POA: Diagnosis not present

## 2023-05-07 DIAGNOSIS — S76312D Strain of muscle, fascia and tendon of the posterior muscle group at thigh level, left thigh, subsequent encounter: Secondary | ICD-10-CM | POA: Diagnosis not present

## 2023-05-19 DIAGNOSIS — Z20822 Contact with and (suspected) exposure to covid-19: Secondary | ICD-10-CM | POA: Diagnosis not present

## 2023-05-19 DIAGNOSIS — J208 Acute bronchitis due to other specified organisms: Secondary | ICD-10-CM | POA: Diagnosis not present

## 2023-06-14 DIAGNOSIS — S76312D Strain of muscle, fascia and tendon of the posterior muscle group at thigh level, left thigh, subsequent encounter: Secondary | ICD-10-CM | POA: Diagnosis not present

## 2023-06-18 DIAGNOSIS — S76312D Strain of muscle, fascia and tendon of the posterior muscle group at thigh level, left thigh, subsequent encounter: Secondary | ICD-10-CM | POA: Diagnosis not present

## 2023-06-21 DIAGNOSIS — S76312D Strain of muscle, fascia and tendon of the posterior muscle group at thigh level, left thigh, subsequent encounter: Secondary | ICD-10-CM | POA: Diagnosis not present

## 2023-06-25 DIAGNOSIS — S76392S Other specified injury of muscle, fascia and tendon of the posterior muscle group at thigh level, left thigh, sequela: Secondary | ICD-10-CM | POA: Diagnosis not present

## 2023-06-25 DIAGNOSIS — M7541 Impingement syndrome of right shoulder: Secondary | ICD-10-CM | POA: Diagnosis not present

## 2023-06-25 DIAGNOSIS — S76312D Strain of muscle, fascia and tendon of the posterior muscle group at thigh level, left thigh, subsequent encounter: Secondary | ICD-10-CM | POA: Diagnosis not present

## 2023-06-25 DIAGNOSIS — M545 Low back pain, unspecified: Secondary | ICD-10-CM | POA: Diagnosis not present

## 2023-06-28 DIAGNOSIS — S76312D Strain of muscle, fascia and tendon of the posterior muscle group at thigh level, left thigh, subsequent encounter: Secondary | ICD-10-CM | POA: Diagnosis not present

## 2023-07-02 DIAGNOSIS — S76312D Strain of muscle, fascia and tendon of the posterior muscle group at thigh level, left thigh, subsequent encounter: Secondary | ICD-10-CM | POA: Diagnosis not present

## 2023-07-03 DIAGNOSIS — R5383 Other fatigue: Secondary | ICD-10-CM | POA: Diagnosis not present

## 2023-07-03 DIAGNOSIS — E039 Hypothyroidism, unspecified: Secondary | ICD-10-CM | POA: Diagnosis not present

## 2023-07-03 DIAGNOSIS — E611 Iron deficiency: Secondary | ICD-10-CM | POA: Diagnosis not present

## 2023-07-04 ENCOUNTER — Encounter: Payer: Self-pay | Admitting: Sports Medicine

## 2023-07-04 ENCOUNTER — Other Ambulatory Visit: Payer: Self-pay | Admitting: Sports Medicine

## 2023-07-04 DIAGNOSIS — S76392S Other specified injury of muscle, fascia and tendon of the posterior muscle group at thigh level, left thigh, sequela: Secondary | ICD-10-CM

## 2023-07-05 DIAGNOSIS — S76312D Strain of muscle, fascia and tendon of the posterior muscle group at thigh level, left thigh, subsequent encounter: Secondary | ICD-10-CM | POA: Diagnosis not present

## 2023-07-09 DIAGNOSIS — S76312D Strain of muscle, fascia and tendon of the posterior muscle group at thigh level, left thigh, subsequent encounter: Secondary | ICD-10-CM | POA: Diagnosis not present

## 2023-07-12 DIAGNOSIS — S76312D Strain of muscle, fascia and tendon of the posterior muscle group at thigh level, left thigh, subsequent encounter: Secondary | ICD-10-CM | POA: Diagnosis not present

## 2023-07-17 DIAGNOSIS — S76312D Strain of muscle, fascia and tendon of the posterior muscle group at thigh level, left thigh, subsequent encounter: Secondary | ICD-10-CM | POA: Diagnosis not present

## 2023-07-18 ENCOUNTER — Ambulatory Visit
Admission: RE | Admit: 2023-07-18 | Discharge: 2023-07-18 | Disposition: A | Discharge status: Home / Self Care | Source: Ambulatory Visit | Attending: Sports Medicine | Admitting: Sports Medicine

## 2023-07-18 DIAGNOSIS — M79652 Pain in left thigh: Secondary | ICD-10-CM | POA: Diagnosis not present

## 2023-07-18 DIAGNOSIS — S76392S Other specified injury of muscle, fascia and tendon of the posterior muscle group at thigh level, left thigh, sequela: Secondary | ICD-10-CM

## 2023-07-19 DIAGNOSIS — S76312D Strain of muscle, fascia and tendon of the posterior muscle group at thigh level, left thigh, subsequent encounter: Secondary | ICD-10-CM | POA: Diagnosis not present

## 2023-07-22 DIAGNOSIS — T148XXA Other injury of unspecified body region, initial encounter: Secondary | ICD-10-CM | POA: Diagnosis not present

## 2023-07-22 DIAGNOSIS — L0889 Other specified local infections of the skin and subcutaneous tissue: Secondary | ICD-10-CM | POA: Diagnosis not present

## 2023-07-24 DIAGNOSIS — S76312D Strain of muscle, fascia and tendon of the posterior muscle group at thigh level, left thigh, subsequent encounter: Secondary | ICD-10-CM | POA: Diagnosis not present

## 2023-07-26 DIAGNOSIS — S76312D Strain of muscle, fascia and tendon of the posterior muscle group at thigh level, left thigh, subsequent encounter: Secondary | ICD-10-CM | POA: Diagnosis not present

## 2023-07-30 DIAGNOSIS — M539 Dorsopathy, unspecified: Secondary | ICD-10-CM | POA: Diagnosis not present

## 2023-07-30 DIAGNOSIS — M5430 Sciatica, unspecified side: Secondary | ICD-10-CM | POA: Diagnosis not present

## 2023-07-30 DIAGNOSIS — S76312D Strain of muscle, fascia and tendon of the posterior muscle group at thigh level, left thigh, subsequent encounter: Secondary | ICD-10-CM | POA: Diagnosis not present

## 2023-08-02 DIAGNOSIS — S76312D Strain of muscle, fascia and tendon of the posterior muscle group at thigh level, left thigh, subsequent encounter: Secondary | ICD-10-CM | POA: Diagnosis not present

## 2023-08-16 DIAGNOSIS — M79652 Pain in left thigh: Secondary | ICD-10-CM | POA: Diagnosis not present

## 2023-08-28 DIAGNOSIS — M25452 Effusion, left hip: Secondary | ICD-10-CM | POA: Diagnosis not present

## 2023-08-28 DIAGNOSIS — L810 Postinflammatory hyperpigmentation: Secondary | ICD-10-CM | POA: Diagnosis not present

## 2023-08-28 DIAGNOSIS — M25652 Stiffness of left hip, not elsewhere classified: Secondary | ICD-10-CM | POA: Diagnosis not present

## 2023-08-28 DIAGNOSIS — M795 Residual foreign body in soft tissue: Secondary | ICD-10-CM | POA: Diagnosis not present

## 2023-08-28 DIAGNOSIS — M25552 Pain in left hip: Secondary | ICD-10-CM | POA: Diagnosis not present

## 2023-08-28 DIAGNOSIS — M6281 Muscle weakness (generalized): Secondary | ICD-10-CM | POA: Diagnosis not present

## 2023-09-07 DIAGNOSIS — M25452 Effusion, left hip: Secondary | ICD-10-CM | POA: Diagnosis not present

## 2023-09-07 DIAGNOSIS — M25552 Pain in left hip: Secondary | ICD-10-CM | POA: Diagnosis not present

## 2023-09-07 DIAGNOSIS — M25652 Stiffness of left hip, not elsewhere classified: Secondary | ICD-10-CM | POA: Diagnosis not present

## 2023-09-07 DIAGNOSIS — M6281 Muscle weakness (generalized): Secondary | ICD-10-CM | POA: Diagnosis not present

## 2023-09-10 DIAGNOSIS — M25552 Pain in left hip: Secondary | ICD-10-CM | POA: Diagnosis not present

## 2023-09-10 DIAGNOSIS — M25652 Stiffness of left hip, not elsewhere classified: Secondary | ICD-10-CM | POA: Diagnosis not present

## 2023-09-10 DIAGNOSIS — M6281 Muscle weakness (generalized): Secondary | ICD-10-CM | POA: Diagnosis not present

## 2023-09-10 DIAGNOSIS — M25452 Effusion, left hip: Secondary | ICD-10-CM | POA: Diagnosis not present

## 2023-09-12 DIAGNOSIS — M25652 Stiffness of left hip, not elsewhere classified: Secondary | ICD-10-CM | POA: Diagnosis not present

## 2023-09-12 DIAGNOSIS — M6281 Muscle weakness (generalized): Secondary | ICD-10-CM | POA: Diagnosis not present

## 2023-09-12 DIAGNOSIS — M25552 Pain in left hip: Secondary | ICD-10-CM | POA: Diagnosis not present

## 2023-09-12 DIAGNOSIS — M25452 Effusion, left hip: Secondary | ICD-10-CM | POA: Diagnosis not present

## 2023-09-17 DIAGNOSIS — M6281 Muscle weakness (generalized): Secondary | ICD-10-CM | POA: Diagnosis not present

## 2023-09-17 DIAGNOSIS — M25652 Stiffness of left hip, not elsewhere classified: Secondary | ICD-10-CM | POA: Diagnosis not present

## 2023-09-17 DIAGNOSIS — M25552 Pain in left hip: Secondary | ICD-10-CM | POA: Diagnosis not present

## 2023-09-17 DIAGNOSIS — M25452 Effusion, left hip: Secondary | ICD-10-CM | POA: Diagnosis not present

## 2023-09-19 DIAGNOSIS — M25452 Effusion, left hip: Secondary | ICD-10-CM | POA: Diagnosis not present

## 2023-09-19 DIAGNOSIS — M6281 Muscle weakness (generalized): Secondary | ICD-10-CM | POA: Diagnosis not present

## 2023-09-19 DIAGNOSIS — M25652 Stiffness of left hip, not elsewhere classified: Secondary | ICD-10-CM | POA: Diagnosis not present

## 2023-09-19 DIAGNOSIS — M25552 Pain in left hip: Secondary | ICD-10-CM | POA: Diagnosis not present

## 2023-09-24 DIAGNOSIS — M25452 Effusion, left hip: Secondary | ICD-10-CM | POA: Diagnosis not present

## 2023-09-24 DIAGNOSIS — M25552 Pain in left hip: Secondary | ICD-10-CM | POA: Diagnosis not present

## 2023-09-24 DIAGNOSIS — M7591 Shoulder lesion, unspecified, right shoulder: Secondary | ICD-10-CM | POA: Diagnosis not present

## 2023-09-24 DIAGNOSIS — M25652 Stiffness of left hip, not elsewhere classified: Secondary | ICD-10-CM | POA: Diagnosis not present

## 2023-09-24 DIAGNOSIS — M6281 Muscle weakness (generalized): Secondary | ICD-10-CM | POA: Diagnosis not present

## 2023-09-26 DIAGNOSIS — L820 Inflamed seborrheic keratosis: Secondary | ICD-10-CM | POA: Diagnosis not present

## 2023-09-26 DIAGNOSIS — M25652 Stiffness of left hip, not elsewhere classified: Secondary | ICD-10-CM | POA: Diagnosis not present

## 2023-09-26 DIAGNOSIS — M6281 Muscle weakness (generalized): Secondary | ICD-10-CM | POA: Diagnosis not present

## 2023-09-26 DIAGNOSIS — D485 Neoplasm of uncertain behavior of skin: Secondary | ICD-10-CM | POA: Diagnosis not present

## 2023-09-26 DIAGNOSIS — D225 Melanocytic nevi of trunk: Secondary | ICD-10-CM | POA: Diagnosis not present

## 2023-09-26 DIAGNOSIS — L538 Other specified erythematous conditions: Secondary | ICD-10-CM | POA: Diagnosis not present

## 2023-09-26 DIAGNOSIS — M25452 Effusion, left hip: Secondary | ICD-10-CM | POA: Diagnosis not present

## 2023-09-26 DIAGNOSIS — L821 Other seborrheic keratosis: Secondary | ICD-10-CM | POA: Diagnosis not present

## 2023-09-26 DIAGNOSIS — M25552 Pain in left hip: Secondary | ICD-10-CM | POA: Diagnosis not present

## 2023-09-26 DIAGNOSIS — Z08 Encounter for follow-up examination after completed treatment for malignant neoplasm: Secondary | ICD-10-CM | POA: Diagnosis not present

## 2023-09-26 DIAGNOSIS — L814 Other melanin hyperpigmentation: Secondary | ICD-10-CM | POA: Diagnosis not present

## 2023-09-27 DIAGNOSIS — E039 Hypothyroidism, unspecified: Secondary | ICD-10-CM | POA: Diagnosis not present

## 2023-09-27 DIAGNOSIS — E7800 Pure hypercholesterolemia, unspecified: Secondary | ICD-10-CM | POA: Diagnosis not present

## 2023-10-03 DIAGNOSIS — M6281 Muscle weakness (generalized): Secondary | ICD-10-CM | POA: Diagnosis not present

## 2023-10-03 DIAGNOSIS — M25452 Effusion, left hip: Secondary | ICD-10-CM | POA: Diagnosis not present

## 2023-10-03 DIAGNOSIS — M25652 Stiffness of left hip, not elsewhere classified: Secondary | ICD-10-CM | POA: Diagnosis not present

## 2023-10-05 DIAGNOSIS — Z03818 Encounter for observation for suspected exposure to other biological agents ruled out: Secondary | ICD-10-CM | POA: Diagnosis not present

## 2023-10-05 DIAGNOSIS — J029 Acute pharyngitis, unspecified: Secondary | ICD-10-CM | POA: Diagnosis not present

## 2023-10-05 DIAGNOSIS — R5383 Other fatigue: Secondary | ICD-10-CM | POA: Diagnosis not present

## 2023-10-10 DIAGNOSIS — M25452 Effusion, left hip: Secondary | ICD-10-CM | POA: Diagnosis not present

## 2023-10-10 DIAGNOSIS — M25652 Stiffness of left hip, not elsewhere classified: Secondary | ICD-10-CM | POA: Diagnosis not present

## 2023-10-10 DIAGNOSIS — M6281 Muscle weakness (generalized): Secondary | ICD-10-CM | POA: Diagnosis not present

## 2023-10-10 DIAGNOSIS — M25552 Pain in left hip: Secondary | ICD-10-CM | POA: Diagnosis not present

## 2023-10-15 DIAGNOSIS — M25552 Pain in left hip: Secondary | ICD-10-CM | POA: Diagnosis not present

## 2023-10-15 DIAGNOSIS — M6281 Muscle weakness (generalized): Secondary | ICD-10-CM | POA: Diagnosis not present

## 2023-10-15 DIAGNOSIS — M25452 Effusion, left hip: Secondary | ICD-10-CM | POA: Diagnosis not present

## 2023-10-15 DIAGNOSIS — M25652 Stiffness of left hip, not elsewhere classified: Secondary | ICD-10-CM | POA: Diagnosis not present

## 2023-10-17 DIAGNOSIS — M6281 Muscle weakness (generalized): Secondary | ICD-10-CM | POA: Diagnosis not present

## 2023-10-17 DIAGNOSIS — M25452 Effusion, left hip: Secondary | ICD-10-CM | POA: Diagnosis not present

## 2023-10-17 DIAGNOSIS — M25552 Pain in left hip: Secondary | ICD-10-CM | POA: Diagnosis not present

## 2023-10-17 DIAGNOSIS — M25652 Stiffness of left hip, not elsewhere classified: Secondary | ICD-10-CM | POA: Diagnosis not present

## 2023-10-18 DIAGNOSIS — K080 Exfoliation of teeth due to systemic causes: Secondary | ICD-10-CM | POA: Diagnosis not present

## 2023-10-22 DIAGNOSIS — M25452 Effusion, left hip: Secondary | ICD-10-CM | POA: Diagnosis not present

## 2023-10-22 DIAGNOSIS — M25552 Pain in left hip: Secondary | ICD-10-CM | POA: Diagnosis not present

## 2023-10-22 DIAGNOSIS — M6281 Muscle weakness (generalized): Secondary | ICD-10-CM | POA: Diagnosis not present

## 2023-10-22 DIAGNOSIS — M25652 Stiffness of left hip, not elsewhere classified: Secondary | ICD-10-CM | POA: Diagnosis not present

## 2023-10-30 DIAGNOSIS — M25452 Effusion, left hip: Secondary | ICD-10-CM | POA: Diagnosis not present

## 2023-10-30 DIAGNOSIS — M25552 Pain in left hip: Secondary | ICD-10-CM | POA: Diagnosis not present

## 2023-10-30 DIAGNOSIS — M25652 Stiffness of left hip, not elsewhere classified: Secondary | ICD-10-CM | POA: Diagnosis not present

## 2023-10-30 DIAGNOSIS — M6281 Muscle weakness (generalized): Secondary | ICD-10-CM | POA: Diagnosis not present

## 2023-11-01 DIAGNOSIS — M25452 Effusion, left hip: Secondary | ICD-10-CM | POA: Diagnosis not present

## 2023-11-01 DIAGNOSIS — M6281 Muscle weakness (generalized): Secondary | ICD-10-CM | POA: Diagnosis not present

## 2023-11-01 DIAGNOSIS — M25652 Stiffness of left hip, not elsewhere classified: Secondary | ICD-10-CM | POA: Diagnosis not present

## 2023-11-19 ENCOUNTER — Other Ambulatory Visit: Payer: Self-pay | Admitting: Family Medicine

## 2023-11-19 DIAGNOSIS — M25452 Effusion, left hip: Secondary | ICD-10-CM | POA: Diagnosis not present

## 2023-11-19 DIAGNOSIS — M25552 Pain in left hip: Secondary | ICD-10-CM | POA: Diagnosis not present

## 2023-11-19 DIAGNOSIS — M25652 Stiffness of left hip, not elsewhere classified: Secondary | ICD-10-CM | POA: Diagnosis not present

## 2023-11-19 DIAGNOSIS — M6281 Muscle weakness (generalized): Secondary | ICD-10-CM | POA: Diagnosis not present

## 2023-11-19 DIAGNOSIS — Z1231 Encounter for screening mammogram for malignant neoplasm of breast: Secondary | ICD-10-CM

## 2023-11-21 DIAGNOSIS — M75111 Incomplete rotator cuff tear or rupture of right shoulder, not specified as traumatic: Secondary | ICD-10-CM | POA: Diagnosis not present

## 2023-11-21 DIAGNOSIS — M67911 Unspecified disorder of synovium and tendon, right shoulder: Secondary | ICD-10-CM | POA: Diagnosis not present

## 2023-11-21 DIAGNOSIS — M7551 Bursitis of right shoulder: Secondary | ICD-10-CM | POA: Diagnosis not present

## 2023-11-21 DIAGNOSIS — M19011 Primary osteoarthritis, right shoulder: Secondary | ICD-10-CM | POA: Diagnosis not present

## 2023-11-27 DIAGNOSIS — E039 Hypothyroidism, unspecified: Secondary | ICD-10-CM | POA: Diagnosis not present

## 2023-11-27 DIAGNOSIS — E538 Deficiency of other specified B group vitamins: Secondary | ICD-10-CM | POA: Diagnosis not present

## 2023-11-27 DIAGNOSIS — E7800 Pure hypercholesterolemia, unspecified: Secondary | ICD-10-CM | POA: Diagnosis not present

## 2023-11-29 DIAGNOSIS — M25652 Stiffness of left hip, not elsewhere classified: Secondary | ICD-10-CM | POA: Diagnosis not present

## 2023-11-29 DIAGNOSIS — Z0000 Encounter for general adult medical examination without abnormal findings: Secondary | ICD-10-CM | POA: Diagnosis not present

## 2023-11-29 DIAGNOSIS — M6281 Muscle weakness (generalized): Secondary | ICD-10-CM | POA: Diagnosis not present

## 2023-11-29 DIAGNOSIS — M25452 Effusion, left hip: Secondary | ICD-10-CM | POA: Diagnosis not present

## 2023-11-29 DIAGNOSIS — M25552 Pain in left hip: Secondary | ICD-10-CM | POA: Diagnosis not present

## 2023-12-03 DIAGNOSIS — M7591 Shoulder lesion, unspecified, right shoulder: Secondary | ICD-10-CM | POA: Diagnosis not present

## 2023-12-03 DIAGNOSIS — E782 Mixed hyperlipidemia: Secondary | ICD-10-CM | POA: Diagnosis not present

## 2023-12-03 DIAGNOSIS — Z23 Encounter for immunization: Secondary | ICD-10-CM | POA: Diagnosis not present

## 2023-12-03 DIAGNOSIS — R350 Frequency of micturition: Secondary | ICD-10-CM | POA: Diagnosis not present

## 2023-12-03 DIAGNOSIS — K219 Gastro-esophageal reflux disease without esophagitis: Secondary | ICD-10-CM | POA: Diagnosis not present

## 2023-12-05 ENCOUNTER — Other Ambulatory Visit (HOSPITAL_BASED_OUTPATIENT_CLINIC_OR_DEPARTMENT_OTHER): Payer: Self-pay | Admitting: Family Medicine

## 2023-12-05 DIAGNOSIS — E782 Mixed hyperlipidemia: Secondary | ICD-10-CM

## 2023-12-06 DIAGNOSIS — M6281 Muscle weakness (generalized): Secondary | ICD-10-CM | POA: Diagnosis not present

## 2023-12-06 DIAGNOSIS — M25452 Effusion, left hip: Secondary | ICD-10-CM | POA: Diagnosis not present

## 2023-12-06 DIAGNOSIS — M25552 Pain in left hip: Secondary | ICD-10-CM | POA: Diagnosis not present

## 2023-12-06 DIAGNOSIS — M25652 Stiffness of left hip, not elsewhere classified: Secondary | ICD-10-CM | POA: Diagnosis not present

## 2023-12-11 DIAGNOSIS — H43811 Vitreous degeneration, right eye: Secondary | ICD-10-CM | POA: Diagnosis not present

## 2023-12-11 DIAGNOSIS — L719 Rosacea, unspecified: Secondary | ICD-10-CM | POA: Diagnosis not present

## 2023-12-11 DIAGNOSIS — Z961 Presence of intraocular lens: Secondary | ICD-10-CM | POA: Diagnosis not present

## 2023-12-12 DIAGNOSIS — Z713 Dietary counseling and surveillance: Secondary | ICD-10-CM | POA: Diagnosis not present

## 2023-12-12 DIAGNOSIS — E7800 Pure hypercholesterolemia, unspecified: Secondary | ICD-10-CM | POA: Diagnosis not present

## 2023-12-12 DIAGNOSIS — E039 Hypothyroidism, unspecified: Secondary | ICD-10-CM | POA: Diagnosis not present

## 2023-12-12 DIAGNOSIS — K219 Gastro-esophageal reflux disease without esophagitis: Secondary | ICD-10-CM | POA: Diagnosis not present

## 2023-12-13 DIAGNOSIS — M6281 Muscle weakness (generalized): Secondary | ICD-10-CM | POA: Diagnosis not present

## 2023-12-13 DIAGNOSIS — M25552 Pain in left hip: Secondary | ICD-10-CM | POA: Diagnosis not present

## 2023-12-13 DIAGNOSIS — M25652 Stiffness of left hip, not elsewhere classified: Secondary | ICD-10-CM | POA: Diagnosis not present

## 2023-12-13 DIAGNOSIS — M25452 Effusion, left hip: Secondary | ICD-10-CM | POA: Diagnosis not present

## 2023-12-14 ENCOUNTER — Ambulatory Visit (HOSPITAL_BASED_OUTPATIENT_CLINIC_OR_DEPARTMENT_OTHER)
Admission: RE | Admit: 2023-12-14 | Discharge: 2023-12-14 | Disposition: A | Payer: Self-pay | Source: Ambulatory Visit | Attending: Family Medicine | Admitting: Family Medicine

## 2023-12-14 DIAGNOSIS — E782 Mixed hyperlipidemia: Secondary | ICD-10-CM | POA: Insufficient documentation

## 2023-12-21 DIAGNOSIS — M25652 Stiffness of left hip, not elsewhere classified: Secondary | ICD-10-CM | POA: Diagnosis not present

## 2023-12-21 DIAGNOSIS — M6281 Muscle weakness (generalized): Secondary | ICD-10-CM | POA: Diagnosis not present

## 2023-12-21 DIAGNOSIS — M25452 Effusion, left hip: Secondary | ICD-10-CM | POA: Diagnosis not present

## 2023-12-21 DIAGNOSIS — M25552 Pain in left hip: Secondary | ICD-10-CM | POA: Diagnosis not present

## 2023-12-25 DIAGNOSIS — L910 Hypertrophic scar: Secondary | ICD-10-CM | POA: Diagnosis not present

## 2023-12-25 DIAGNOSIS — L578 Other skin changes due to chronic exposure to nonionizing radiation: Secondary | ICD-10-CM | POA: Diagnosis not present

## 2023-12-25 DIAGNOSIS — M25511 Pain in right shoulder: Secondary | ICD-10-CM | POA: Diagnosis not present

## 2023-12-27 DIAGNOSIS — M25652 Stiffness of left hip, not elsewhere classified: Secondary | ICD-10-CM | POA: Diagnosis not present

## 2023-12-27 DIAGNOSIS — M25552 Pain in left hip: Secondary | ICD-10-CM | POA: Diagnosis not present

## 2023-12-27 DIAGNOSIS — M6281 Muscle weakness (generalized): Secondary | ICD-10-CM | POA: Diagnosis not present

## 2023-12-27 DIAGNOSIS — M25452 Effusion, left hip: Secondary | ICD-10-CM | POA: Diagnosis not present

## 2024-01-02 DIAGNOSIS — R69 Illness, unspecified: Secondary | ICD-10-CM | POA: Diagnosis not present

## 2024-01-03 ENCOUNTER — Ambulatory Visit
Admission: RE | Admit: 2024-01-03 | Discharge: 2024-01-03 | Disposition: A | Discharge status: Home / Self Care | Source: Ambulatory Visit | Attending: Family Medicine | Admitting: Family Medicine

## 2024-01-03 DIAGNOSIS — Z1231 Encounter for screening mammogram for malignant neoplasm of breast: Secondary | ICD-10-CM

## 2024-01-22 DIAGNOSIS — D485 Neoplasm of uncertain behavior of skin: Secondary | ICD-10-CM | POA: Diagnosis not present

## 2024-01-22 DIAGNOSIS — L538 Other specified erythematous conditions: Secondary | ICD-10-CM | POA: Diagnosis not present

## 2024-01-22 DIAGNOSIS — L820 Inflamed seborrheic keratosis: Secondary | ICD-10-CM | POA: Diagnosis not present

## 2024-01-22 DIAGNOSIS — L578 Other skin changes due to chronic exposure to nonionizing radiation: Secondary | ICD-10-CM | POA: Diagnosis not present

## 2024-02-05 DIAGNOSIS — M7541 Impingement syndrome of right shoulder: Secondary | ICD-10-CM | POA: Diagnosis not present

## 2024-02-05 DIAGNOSIS — M75111 Incomplete rotator cuff tear or rupture of right shoulder, not specified as traumatic: Secondary | ICD-10-CM | POA: Diagnosis not present

## 2024-02-05 DIAGNOSIS — M7520 Bicipital tendinitis, unspecified shoulder: Secondary | ICD-10-CM | POA: Diagnosis not present

## 2024-02-13 DIAGNOSIS — M25511 Pain in right shoulder: Secondary | ICD-10-CM | POA: Diagnosis not present

## 2024-02-13 DIAGNOSIS — M25552 Pain in left hip: Secondary | ICD-10-CM | POA: Diagnosis not present

## 2024-02-14 DIAGNOSIS — D044 Carcinoma in situ of skin of scalp and neck: Secondary | ICD-10-CM | POA: Diagnosis not present
